# Patient Record
Sex: Male | Born: 1985 | Race: Black or African American | Hispanic: No | Marital: Single | Smoking: Never smoker
Health system: Southern US, Community
[De-identification: ages and names within clinical notes are randomized; demographics above are authoritative.]

## PROBLEM LIST (undated history)

## (undated) DIAGNOSIS — J45909 Unspecified asthma, uncomplicated: Secondary | ICD-10-CM

---

## 2016-11-20 ENCOUNTER — Encounter: Payer: Self-pay | Admitting: *Deleted

## 2016-11-20 ENCOUNTER — Emergency Department
Admission: EM | Admit: 2016-11-20 | Discharge: 2016-11-20 | Disposition: A | Discharge status: Home / Self Care | Payer: BLUE CROSS/BLUE SHIELD | Source: Home / Self Care | Attending: Family Medicine | Admitting: Family Medicine

## 2016-11-20 DIAGNOSIS — L600 Ingrowing nail: Secondary | ICD-10-CM | POA: Diagnosis not present

## 2016-11-20 MED ORDER — DOXYCYCLINE HYCLATE 100 MG PO CAPS: 100.0000 mg | ORAL_CAPSULE | Freq: Two times a day (BID) | ORAL | 0 refills | Status: DC

## 2016-11-20 NOTE — Discharge Instructions (Signed)
Leave bandage in place for 24 hours then change.  Begin warm soaks tomorrow once or twice daily until healed.  Apply bandage daily until healed.  May take Ibuprofen 200mg , 4 tabs every 8 hours with food.

## 2016-11-20 NOTE — ED Triage Notes (Signed)
Patient c/o 2 weeks of ingrown toenail to right great toe. Last night site was draining after Epsom salt soaks.

## 2016-11-20 NOTE — ED Provider Notes (Signed)
[name]    CSN: [id number] Arrival date & time: 11/20/16  0807     History   Chief Complaint Chief Complaint  Patient presents with  . Ingrown Toenail    HPI Manuel Thompson is a 31 y.o. male.   Patient complains of two week history of ingrown right great toenail.   The history is provided by the patient.  Toe Pain  This is a new problem. Episode onset: 2 weeks ago. The problem occurs constantly. The problem has been gradually worsening. The symptoms are aggravated by walking. Nothing relieves the symptoms. Treatments tried: Epsom salt soaks. The treatment provided no relief.    History reviewed. No pertinent past medical history.  There are no active problems to display for this patient.   History reviewed. No pertinent surgical history.     Home Medications    Prior to Admission medications   Medication Sig Start Date End Date Taking? Authorizing Provider  doxycycline (VIBRAMYCIN) 100 MG capsule Take 1 capsule (100 mg total) by mouth 2 (two) times daily. Take with food. 11/20/16   Lattie HawStephen A Neviah Braud, MD    Family History History reviewed. No pertinent family history.  Social History Social History  Substance Use Topics  . Smoking status: Never Smoker  . Smokeless tobacco: Never Used  . Alcohol use No     Allergies   Amoxicillin   Review of Systems Review of Systems  Constitutional: Negative for chills, diaphoresis and fever.  All other systems reviewed and are negative.    Physical Exam Triage Vital Signs ED Triage Vitals  Enc Vitals Group     BP 11/20/16 0832 115/75     Pulse Rate 11/20/16 0832 77     Resp --      Temp 11/20/16 0832 97.9 F (36.6 C)     Temp Source 11/20/16 0832 Oral     SpO2 11/20/16 0832 99 %     Weight 11/20/16 0833 194 lb (88 kg)     Height 11/20/16 0833 6' (1.829 m)     Head Circumference --      Peak Flow --      Pain Score 11/20/16 0838 7     Pain Loc --      Pain Edu? --      Excl. in GC? --     No data found.   Updated Vital Signs BP 115/75 (BP Location: Left Arm)   Pulse 77   Temp 97.9 F (36.6 C) (Oral)   Ht 6' (1.829 m)   Wt 194 lb (88 kg)   SpO2 99%   BMI 26.31 kg/m   Visual Acuity Right Eye Distance:   Left Eye Distance:   Bilateral Distance:    Right Eye Near:   Left Eye Near:    Bilateral Near:     Physical Exam  Constitutional: He appears well-developed and well-nourished. No distress.  HENT:  Head: Normocephalic.  Eyes: Pupils are equal, round, and reactive to light.  Cardiovascular: Normal rate.   Pulmonary/Chest: Effort normal.  Musculoskeletal:       Right foot: There is tenderness.       Feet:  Medial edge of right great toenail is ingrown, with mild swelling, erythema, and tenderness at medial edge of toenail.  Neurological: He is alert.  Skin: Skin is warm and dry.  Nursing note and vitals reviewed.    UC Treatments / Results  Labs (all labs ordered are listed, but only abnormal results are displayed)  Labs Reviewed  WOUND CULTURE   Narrative:    Performed at:  [facility]                [address], [address]                [place], [place] [address]    EKG  EKG Interpretation None       Radiology No results found.  Procedures Procedures  Partial toenail excision Explained benefits and risks of procedure and consent obtained.  With sterile technique and digital 0.25% bupivacaine anesthesia, resected approximately 3mm segment of medial edge of right first toenail without difficulty.  Cauterized base of exposed nail bed with silver nitrate.  Bandage with Xeroform gauze.  Wound precautions given.  Begin doxycycline.    Medications Ordered in UC Medications - No data to display   Initial Impression / Assessment and Plan / UC Course  I have reviewed the triage vital signs and the nursing notes.  Pertinent labs & imaging results that were available during my care of the patient were reviewed by me and considered  in my medical decision making (see chart for details).    Wound culture pending.  Begin doxycycline 100mg  BID for staph coverage. Leave bandage in place for 24 hours then change.  Begin warm soaks tomorrow once or twice daily until healed.  Apply bandage daily until healed.  May take Ibuprofen 200mg , 4 tabs every 8 hours with food.  Call for signs of worsening infection.    Final Clinical Impressions(s) / UC Diagnoses   Final diagnoses:  Ingrown right greater toenail    New Prescriptions Discharge Medication List as of 11/20/2016  9:40 AM    START taking these medications   Details  doxycycline (VIBRAMYCIN) 100 MG capsule Take 1 capsule (100 mg total) by mouth 2 (two) times daily. Take with food., Starting Fri 11/20/2016, Normal         Lattie Haw, MD 11/24/16 2226

## 2016-11-22 ENCOUNTER — Telehealth: Payer: Self-pay | Admitting: Emergency Medicine

## 2016-11-22 LAB — WOUND CULTURE
Gram Stain: NONE SEEN
Gram Stain: NONE SEEN
Organism ID, Bacteria: NORMAL

## 2016-11-22 NOTE — Telephone Encounter (Signed)
Pt informed of negative wound culture.  TMartin,CMA

## 2017-03-30 ENCOUNTER — Emergency Department
Admission: EM | Admit: 2017-03-30 | Discharge: 2017-03-30 | Disposition: A | Discharge status: Home / Self Care | Payer: BLUE CROSS/BLUE SHIELD | Source: Home / Self Care | Attending: Family Medicine | Admitting: Family Medicine

## 2017-03-30 ENCOUNTER — Encounter: Payer: Self-pay | Admitting: *Deleted

## 2017-03-30 DIAGNOSIS — J020 Streptococcal pharyngitis: Secondary | ICD-10-CM | POA: Diagnosis not present

## 2017-03-30 LAB — POCT RAPID STREP A (OFFICE): Rapid Strep A Screen: POSITIVE — AB

## 2017-03-30 MED ORDER — AZITHROMYCIN 250 MG PO TABS: 250.0000 mg | ORAL_TABLET | Freq: Every day | ORAL | 0 refills | Status: DC

## 2017-03-30 NOTE — ED Triage Notes (Signed)
Pt c/o swollen tonsils with pus on them x 2 days. Denies pain, cough or fever.

## 2017-03-30 NOTE — ED Provider Notes (Signed)
CSN: [id number]     Arrival date & time 03/30/17  0806 History   First MD Initiated Contact with Patient 03/30/17 [phone]     Chief Complaint  Patient presents with  . Sore Throat   (Consider location/radiation/quality/duration/timing/severity/associated sxs/prior Treatment) HPI  Manuel Thompson is a 31 y.o. male presenting to UC with c/o minimally sore throat and noticeable swollen tonsils with pus that started 2 days ago.  Swelling has been waxing and waning. Mild temporary relief with saltwater gargles and ibuprofen.  Pt notes he feels well otherwise. Denies fever, chills, HA, abdominal pain or n/v/d. No known sick contacts or recent travel.   History reviewed. No pertinent past medical history. History reviewed. No pertinent surgical history. History reviewed. No pertinent family history. Social History  Substance Use Topics  . Smoking status: Never Smoker  . Smokeless tobacco: Never Used  . Alcohol use Yes     Comment: 1-2 q wk    Review of Systems  Constitutional: Negative for chills and fever.  HENT: Positive for sore throat. Negative for congestion, ear pain, trouble swallowing and voice change.   Respiratory: Negative for cough and shortness of breath.   Cardiovascular: Negative for chest pain and palpitations.  Gastrointestinal: Negative for abdominal pain, diarrhea, nausea and vomiting.  Musculoskeletal: Negative for arthralgias, back pain and myalgias.  Skin: Negative for rash.  Neurological: Negative for dizziness, light-headedness and headaches.    Allergies  Amoxicillin  Home Medications   Prior to Admission medications   Medication Sig Start Date End Date Taking? Authorizing Provider  azithromycin (ZITHROMAX) 250 MG tablet Take 1 tablet (250 mg total) by mouth daily. Take first 2 tablets together, then 1 every day until finished. 03/30/17   Lurene Shadow, PA-C   Meds Ordered and Administered this Visit  Medications - No data to display  BP 127/88 (BP  Location: Left Arm)   Pulse 88   Temp 98.2 F (36.8 C) (Oral)   Resp 16   Ht 6' (1.829 m)   Wt 190 lb (86.2 kg)   SpO2 98%   BMI 25.77 kg/m  No data found.   Physical Exam  Constitutional: He is oriented to person, place, and time. He appears well-developed and well-nourished. No distress.  HENT:  Head: Normocephalic and atraumatic.  Right Ear: Tympanic membrane normal.  Left Ear: Tympanic membrane normal.  Nose: Nose normal.  Mouth/Throat: Uvula is midline and mucous membranes are normal. Oropharyngeal exudate, posterior oropharyngeal edema and posterior oropharyngeal erythema present. No tonsillar abscesses.  Eyes: EOM are normal.  Neck: Normal range of motion. Neck supple.  Cardiovascular: Normal rate and regular rhythm.   Pulmonary/Chest: Effort normal and breath sounds normal. No stridor. No respiratory distress. He has no wheezes. He has no rales.  Musculoskeletal: Normal range of motion.  Lymphadenopathy:    He has cervical adenopathy.  Neurological: He is alert and oriented to person, place, and time.  Skin: Skin is warm and dry. He is not diaphoretic.  Psychiatric: He has a normal mood and affect. His behavior is normal.  Nursing note and vitals reviewed.   Urgent Care Course     Procedures (including critical care time)  Labs Review Labs Reviewed  POCT RAPID STREP A (OFFICE) - Abnormal; Notable for the following:       Result Value   Rapid Strep A Screen Positive (*)    All other components within normal limits    Imaging Review No results found.   MDM  1. Strep pharyngitis    Rapid strep: POSITIVE  Pt has questionable allergy to PCN, possible anaphylaxis.  Rx: Azithromycin Encouraged acetaminophen and ibuprofen Home care instructions provided F/u with PCP in 1 week if not improving.     Lurene Shadowhelps, Kataleah Bejar O, [place] JerseyPA-C 03/30/17 1003

## 2017-05-03 ENCOUNTER — Encounter: Payer: Self-pay | Admitting: Emergency Medicine

## 2017-05-03 ENCOUNTER — Emergency Department
Admission: EM | Admit: 2017-05-03 | Discharge: 2017-05-03 | Disposition: A | Discharge status: Home / Self Care | Payer: BLUE CROSS/BLUE SHIELD | Source: Home / Self Care | Attending: Family Medicine | Admitting: Family Medicine

## 2017-05-03 DIAGNOSIS — S39012A Strain of muscle, fascia and tendon of lower back, initial encounter: Secondary | ICD-10-CM | POA: Diagnosis not present

## 2017-05-03 MED ORDER — MELOXICAM 15 MG PO TABS: 15.0000 mg | ORAL_TABLET | Freq: Every day | ORAL | 0 refills | Status: DC

## 2017-05-03 MED ORDER — CYCLOBENZAPRINE HCL 10 MG PO TABS: 10.0000 mg | ORAL_TABLET | Freq: Every day | ORAL | 0 refills | Status: DC

## 2017-05-03 NOTE — Discharge Instructions (Signed)
Apply ice pack for 20 to 30 minutes, 3 to 4 times daily  Continue until pain and swelling decrease.  Begin range of motion and stretching exercises as tolerated. Consider Alexander technique for maintaining healthy back.

## 2017-05-03 NOTE — ED Triage Notes (Signed)
Pt c/o low back pain after lifting his bike rack x2 days ago. Took motrin and a "muscle relaxer he had at home:" and states it helped.

## 2017-05-03 NOTE — ED Provider Notes (Signed)
[name]    CSN: [id number] Arrival date & time: 05/03/17  1951     History   Chief Complaint Chief Complaint  Patient presents with  . Back Pain    HPI Manuel Thompson is a 31 y.o. male.   Patient carried his bike rack in an awkward manner two days ago and subsequently felt bilateral non-radiating low back ache.   He denies bowel or bladder dysfunction, and no saddle numbness.  He had some left-over Flexeril that helped.   The history is provided by the patient.  Back Pain  Location:  Lumbar spine Quality:  Aching Radiates to:  Does not radiate Pain severity:  Moderate Pain is:  Worse during the day Onset quality:  Gradual Duration:  2 days Timing:  Constant Progression:  Unchanged Chronicity:  New Context: lifting heavy objects   Relieved by:  Muscle relaxants Worsened by:  Ambulation Associated symptoms: no abdominal pain, no abdominal swelling, no bladder incontinence, no bowel incontinence, no chest pain, no dysuria, no fever, no leg pain, no numbness, no paresthesias, no perianal numbness, no tingling and no weakness     History reviewed. No pertinent past medical history.  There are no active problems to display for this patient.   History reviewed. No pertinent surgical history.     Home Medications    Prior to Admission medications   Medication Sig Start Date End Date Taking? Authorizing Provider  cyclobenzaprine (FLEXERIL) 10 MG tablet Take 1 tablet (10 mg total) by mouth at bedtime. 05/03/17   Lattie HawBeese, Salim Forero A, MD  meloxicam (MOBIC) 15 MG tablet Take 1 tablet (15 mg total) by mouth daily. Take with food each morning 05/03/17   Lattie HawBeese, Ryon Layton A, MD    Family History History reviewed. No pertinent family history.  Social History Social History  Substance Use Topics  . Smoking status: Never Smoker  . Smokeless tobacco: Never Used  . Alcohol use Yes     Comment: 1-2 q wk     Allergies   Amoxicillin   Review of  Systems Review of Systems  Constitutional: Negative for fever.  Cardiovascular: Negative for chest pain.  Gastrointestinal: Negative for abdominal pain and bowel incontinence.  Genitourinary: Negative for bladder incontinence and dysuria.  Musculoskeletal: Positive for back pain.  Neurological: Negative for tingling, weakness, numbness and paresthesias.  All other systems reviewed and are negative.    Physical Exam Triage Vital Signs ED Triage Vitals  Enc Vitals Group     BP 05/03/17 2012 114/76     Pulse Rate 05/03/17 2012 78     Resp --      Temp 05/03/17 2012 98.4 F (36.9 C)     Temp Source 05/03/17 2012 Oral     SpO2 05/03/17 2012 98 %     Weight 05/03/17 2013 194 lb (88 kg)     Height --      Head Circumference --      Peak Flow --      Pain Score 05/03/17 2013 4     Pain Loc --      Pain Edu? --      Excl. in GC? --    No data found.   Updated Vital Signs BP 114/76 (BP Location: Right Arm)   Pulse 78   Temp 98.4 F (36.9 C) (Oral)   Wt 194 lb (88 kg)   SpO2 98%   BMI 26.31 kg/m   Visual Acuity Right Eye Distance:   Left Eye  Distance:   Bilateral Distance:    Right Eye Near:   Left Eye Near:    Bilateral Near:     Physical Exam  Constitutional: He appears well-developed and well-nourished. No distress.  HENT:  Head: Normocephalic.  Nose: Nose normal.  Mouth/Throat: Oropharynx is clear and moist.  Eyes: Pupils are equal, round, and reactive to light. Conjunctivae are normal.  Neck: Normal range of motion.  Cardiovascular: Normal heart sounds.   Pulmonary/Chest: Breath sounds normal.  Abdominal: There is no tenderness.  Musculoskeletal: He exhibits no edema.       Lumbar back: He exhibits decreased range of motion. He exhibits no tenderness and no swelling.       Back:  Back:  Can heel/toe walk and squat without difficulty.  Patient has pain in lower back as noted on diagram, but there is no tenderness to palpation in this area. Straight leg  raising test is negative.  Sitting knee extension test is negative.  Strength and sensation in the lower extremities is normal.  Patellar and achilles reflexes are normal   Neurological: He is alert.  Skin: Skin is warm and dry.  Nursing note and vitals reviewed.    UC Treatments / Results  Labs (all labs ordered are listed, but only abnormal results are displayed) Labs Reviewed - No data to display  EKG  EKG Interpretation None       Radiology No results found.  Procedures Procedures (including critical care time)  Medications Ordered in UC Medications - No data to display   Initial Impression / Assessment and Plan / UC Course  I have reviewed the triage vital signs and the nursing notes.  Pertinent labs & imaging results that were available during my care of the patient were reviewed by me and considered in my medical decision making (see chart for details).    Begin Mobic and Flexeril. Apply ice pack for 20 to 30 minutes, 3 to 4 times daily  Continue until pain and swelling decrease.  Begin range of motion and stretching exercises as tolerated. Consider Alexander technique for maintaining healthy back. Followup with Dr. Rodney Langtonhomas Thekkekandam or Dr. Clementeen GrahamEvan Corey ([facility]) if not improving about two weeks.     Final Clinical Impressions(s) / UC Diagnoses   Final diagnoses:  Strain of lumbar region, initial encounter    New Prescriptions New Prescriptions   CYCLOBENZAPRINE (FLEXERIL) 10 MG TABLET    Take 1 tablet (10 mg total) by mouth at bedtime.   MELOXICAM (MOBIC) 15 MG TABLET    Take 1 tablet (15 mg total) by mouth daily. Take with food each morning     Lattie HawBeese, Thamar Holik A, MD 05/04/17 1046

## 2017-08-16 ENCOUNTER — Emergency Department
Admission: EM | Admit: 2017-08-16 | Discharge: 2017-08-16 | Disposition: A | Discharge status: Home / Self Care | Payer: BLUE CROSS/BLUE SHIELD | Source: Home / Self Care | Attending: Family Medicine | Admitting: Family Medicine

## 2017-08-16 ENCOUNTER — Other Ambulatory Visit: Payer: Self-pay

## 2017-08-16 ENCOUNTER — Encounter: Payer: Self-pay | Admitting: Emergency Medicine

## 2017-08-16 DIAGNOSIS — M5442 Lumbago with sciatica, left side: Secondary | ICD-10-CM | POA: Diagnosis not present

## 2017-08-16 MED ORDER — METHOCARBAMOL 500 MG PO TABS: 500.0000 mg | ORAL_TABLET | Freq: Two times a day (BID) | ORAL | 0 refills | Status: DC

## 2017-08-16 MED ORDER — PREDNISONE 20 MG PO TABS: ORAL_TABLET | ORAL | 0 refills | Status: DC

## 2017-08-16 MED ORDER — MELOXICAM 15 MG PO TABS: 15.0000 mg | ORAL_TABLET | Freq: Every day | ORAL | 0 refills | Status: DC

## 2017-08-16 NOTE — Discharge Instructions (Signed)
°  Robaxin (methocarbamol) is a muscle relaxer and may cause drowsiness. Do not drink alcohol, drive, or operate heavy machinery while taking. ° °Meloxicam (Mobic) is an antiinflammatory to help with pain and inflammation.  Do not take ibuprofen, Advil, Aleve, or any other medications that contain NSAIDs while taking meloxicam as this may cause stomach upset or even ulcers if taken in large amounts for an extended period of time.  ° °

## 2017-08-16 NOTE — ED Provider Notes (Signed)
[name]    CSN: [id number] Arrival date & time: 08/16/17  1724     History   Chief Complaint Chief Complaint  Patient presents with  . Leg Pain    HPI Laurey MoraleDominique Benninger is a 31 y.o. male.   HPI  Laurey MoraleDominique Dippolito is a 31 y.o. male presenting to UC with c/o 3 weeks of intermittent, gradually worsening Left lower back pain that radiates down Left leg into foot.  Pain is aching and sore, sharp on occasion, worse after trying to stretch his muscles yesterday.  Pt woke with the pain 3 weeks ago. No known injury. He does report sleeping on a couch frequently and wonders if that caused his pain.  Hx of lower back pain w/o radiation down his leg in July of this year. He was prescribed Flexeril at that time and has been trying that for current pain w/o relief. He has not tried Tylenol or Motrin.  Denies urinary symptoms. No fever or chills.    History reviewed. No pertinent past medical history.  There are no active problems to display for this patient.   History reviewed. No pertinent surgical history.     Home Medications    Prior to Admission medications   Medication Sig Start Date End Date Taking? Authorizing Provider  cyclobenzaprine (FLEXERIL) 10 MG tablet Take 1 tablet (10 mg total) by mouth at bedtime. 05/03/17   Lattie HawBeese, Stephen A, MD  meloxicam (MOBIC) 15 MG tablet Take 1 tablet (15 mg total) daily by mouth. For 5 days, then daily as needed for pain 08/16/17   Lurene ShadowPhelps, Jovon Winterhalter O, PA-C  methocarbamol (ROBAXIN) 500 MG tablet Take 1 tablet (500 mg total) 2 (two) times daily by mouth. 08/16/17   Lurene ShadowPhelps, Kimala Horne O, PA-C  predniSONE (DELTASONE) 20 MG tablet 3 tabs po day one, then 2 po daily x 4 days 08/16/17   Lurene ShadowPhelps, Siya Flurry O, PA-C    Family History No family history on file.  Social History Social History   Tobacco Use  . Smoking status: Never Smoker  . Smokeless tobacco: Never Used  Substance Use Topics  . Alcohol use: Yes    Comment: 1-2 q wk  . Drug use: No       Allergies   Amoxicillin   Review of Systems Review of Systems  Constitutional: Negative for chills and fever.  Genitourinary: Negative for dysuria, flank pain, frequency and hematuria.  Musculoskeletal: Positive for back pain and myalgias. Negative for arthralgias, gait problem and joint swelling.  Skin: Negative for color change and rash.  Neurological: Negative for weakness and numbness.     Physical Exam Triage Vital Signs ED Triage Vitals  Enc Vitals Group     BP 08/16/17 1738 120/79     Pulse Rate 08/16/17 1738 100     Resp 08/16/17 1738 16     Temp 08/16/17 1738 98.1 F (36.7 C)     Temp Source 08/16/17 1738 Oral     SpO2 08/16/17 1738 98 %     Weight 08/16/17 1739 192 lb (87.1 kg)     Height 08/16/17 1739 6' (1.829 m)     Head Circumference --      Peak Flow --      Pain Score 08/16/17 1739 6     Pain Loc --      Pain Edu? --      Excl. in GC? --    No data found.  Updated Vital Signs BP 120/79 (BP Location: Right  Arm)   Pulse 100   Temp 98.1 F (36.7 C) (Oral)   Resp 16   Ht 6' (1.829 m)   Wt 192 lb (87.1 kg)   SpO2 98%   BMI 26.04 kg/m   Visual Acuity Right Eye Distance:   Left Eye Distance:   Bilateral Distance:    Right Eye Near:   Left Eye Near:    Bilateral Near:     Physical Exam  Constitutional: He is oriented to person, place, and time. He appears well-developed and well-nourished. No distress.  HENT:  Head: Normocephalic and atraumatic.  Eyes: EOM are normal.  Neck: Normal range of motion.  Cardiovascular: Normal rate.  Pulmonary/Chest: Effort normal. No respiratory distress.  Musculoskeletal: Normal range of motion. He exhibits tenderness. He exhibits no edema.  No midline spinal tenderness. Tenderness to Left lower back, Left buttock, and mildly in Left calf. Calf is soft.  Negative straight leg raise.  Normal gait.  Neurological: He is alert and oriented to person, place, and time.  Skin: Skin is warm and dry. Capillary  refill takes less than 2 seconds. No rash noted. He is not diaphoretic. No erythema.  Psychiatric: He has a normal mood and affect. His behavior is normal.  Nursing note and vitals reviewed.    UC Treatments / Results  Labs (all labs ordered are listed, but only abnormal results are displayed) Labs Reviewed - No data to display  EKG  EKG Interpretation None       Radiology No results found.  Procedures Procedures (including critical care time)  Medications Ordered in UC Medications - No data to display   Initial Impression / Assessment and Plan / UC Course  I have reviewed the triage vital signs and the nursing notes.  Pertinent labs & imaging results that were available during my care of the patient were reviewed by me and considered in my medical decision making (see chart for details).     Hx and exam c/w Left lower back pain with Left side sciatica. No red flag symptoms Home care instrucitons provided F/u with PCP or Sports Medicine in 1 week if not improving or for recurrent back pain.   Final Clinical Impressions(s) / UC Diagnoses   Final diagnoses:  Acute left-sided low back pain with left-sided sciatica    ED Discharge Orders        Ordered    methocarbamol (ROBAXIN) 500 MG tablet  2 times daily     08/16/17 1801    meloxicam (MOBIC) 15 MG tablet  Daily     08/16/17 1801    predniSONE (DELTASONE) 20 MG tablet     08/16/17 1801       Controlled Substance Prescriptions [place] Controlled Substance Registry consulted? Not Applicable   Rolla Platehelps, Cashae Weich O, PA-C 08/16/17 1946

## 2017-08-16 NOTE — ED Triage Notes (Signed)
For past 3 weeks patient describes pain starting on top of his left foot that radiates up his leg into buttock area, depending on certain movements; no known injury. Has tried leftover muscle relaxant that did not provide relief.

## 2017-10-14 ENCOUNTER — Emergency Department
Admission: EM | Admit: 2017-10-14 | Discharge: 2017-10-14 | Disposition: A | Discharge status: Home / Self Care | Payer: BLUE CROSS/BLUE SHIELD | Source: Home / Self Care | Attending: Family Medicine | Admitting: Family Medicine

## 2017-10-14 ENCOUNTER — Other Ambulatory Visit: Payer: Self-pay

## 2017-10-14 ENCOUNTER — Encounter: Payer: Self-pay | Admitting: *Deleted

## 2017-10-14 DIAGNOSIS — M5432 Sciatica, left side: Secondary | ICD-10-CM

## 2017-10-14 DIAGNOSIS — Z113 Encounter for screening for infections with a predominantly sexual mode of transmission: Secondary | ICD-10-CM | POA: Diagnosis not present

## 2017-10-14 MED ORDER — MELOXICAM 15 MG PO TABS: 15.0000 mg | ORAL_TABLET | Freq: Every day | ORAL | 0 refills | Status: DC

## 2017-10-14 MED ORDER — METHOCARBAMOL 500 MG PO TABS: 500.0000 mg | ORAL_TABLET | Freq: Two times a day (BID) | ORAL | 0 refills | Status: DC

## 2017-10-14 NOTE — ED Triage Notes (Signed)
Patient c/o left leg and glute pain. Previous sciatica diagnosis. Wants STD screening. Asymptomatic.   Password for results: "[id number]" phone #[phone]

## 2017-10-14 NOTE — ED Provider Notes (Signed)
[facility]    CSN: [id number] Arrival date & time: 10/14/17  1203     History   Chief Complaint Chief Complaint  Patient presents with  . Sciatica  . STD screen    HPI Manuel Thompson is a 32 y.o. male.   The history is provided by the patient. No language interpreter was used.  Back Pain  Location:  Sacro-iliac joint Quality:  Aching Radiates to:  L posterior upper leg Pain severity:  Mild Pain is:  Same all the time Onset quality:  Unable to specify Timing:  Constant Progression:  Partially resolved Chronicity:  New Relieved by:  Nothing Worsened by:  Nothing Ineffective treatments:  None tried Associated symptoms: no paresthesias, no perianal numbness and no weakness   Pt reports some decreased range of motion  History reviewed. No pertinent past medical history.  There are no active problems to display for this patient.   History reviewed. No pertinent surgical history.     Home Medications    Prior to Admission medications   Medication Sig Start Date End Date Taking? Authorizing Provider  meloxicam (MOBIC) 15 MG tablet Take 1 tablet (15 mg total) by mouth daily. 10/14/17   Elson Areas, PA-C  methocarbamol (ROBAXIN) 500 MG tablet Take 1 tablet (500 mg total) by mouth 2 (two) times daily. 10/14/17   Elson Areas, PA-C    Family History History reviewed. No pertinent family history.  Social History Social History   Tobacco Use  . Smoking status: Never Smoker  . Smokeless tobacco: Never Used  Substance Use Topics  . Alcohol use: Yes    Comment: 1-2 q wk  . Drug use: No     Allergies   Amoxicillin   Review of Systems Review of Systems  Musculoskeletal: Positive for back pain.  Neurological: Negative for weakness and paresthesias.  All other systems reviewed and are negative.    Physical Exam Triage Vital Signs ED Triage Vitals [10/14/17 1231]  Enc Vitals Group     BP 126/83     Pulse Rate 90     Resp    Temp 98.4 F (36.9 C)     Temp Source Oral     SpO2 98 %     Weight 195 lb (88.5 kg)     Height      Head Circumference      Peak Flow      Pain Score 2     Pain Loc      Pain Edu?      Excl. in GC?    No data found.  Updated Vital Signs BP 126/83 (BP Location: Right Arm)   Pulse 90   Temp 98.4 F (36.9 C) (Oral)   Wt 195 lb (88.5 kg)   SpO2 98%   BMI 26.45 kg/m   Visual Acuity Right Eye Distance:   Left Eye Distance:   Bilateral Distance:    Right Eye Near:   Left Eye Near:    Bilateral Near:     Physical Exam  Constitutional: He appears well-developed and well-nourished.  HENT:  Head: Normocephalic and atraumatic.  Eyes: Conjunctivae are normal.  Neck: Neck supple.  Cardiovascular: Normal rate and regular rhythm.  No murmur heard. Pulmonary/Chest: Effort normal and breath sounds normal. No respiratory distress.  Abdominal: Soft. There is no tenderness.  Musculoskeletal: He exhibits no edema.  Tender sciatic notch.  nv and ns itnact  Neurological: He is alert.  Skin: Skin is warm and  dry.  Psychiatric: He has a normal mood and affect.  Nursing note and vitals reviewed.    UC Treatments / Results  Labs (all labs ordered are listed, but only abnormal results are displayed) Labs Reviewed  C. TRACHOMATIS/N. GONORRHOEAE RNA  HIV ANTIBODY (ROUTINE TESTING)  RPR    EKG  EKG Interpretation None       Radiology No results found.  Procedures Procedures (including critical care time)  Medications Ordered in UC Medications - No data to display   Initial Impression / Assessment and Plan / UC Course  I have reviewed the triage vital signs and the nursing notes.  Pertinent labs & imaging results that were available during my care of the patient were reviewed by me and considered in my medical decision making (see chart for details).    Pt also request std screening.  No symptoms.  Pt denies high risk behavior.  He reports he would just like to  have checked.  HIV, RPR, GC and Ct ordered.    Final Clinical Impressions(s) / UC Diagnoses   Final diagnoses:  Screen for STD (sexually transmitted disease)  Sciatica, left side    ED Discharge Orders        Ordered    meloxicam (MOBIC) 15 MG tablet  Daily     10/14/17 1238    methocarbamol (ROBAXIN) 500 MG tablet  2 times daily     10/14/17 1238       Controlled Substance Prescriptions [place] Controlled Substance Registry consulted? Not Applicable  An After Visit Summary was printed and given to the patient.    Elson AreasSofia, Taffy Delconte K, [place]PA-C 10/14/17 1252

## 2017-10-14 NOTE — Discharge Instructions (Signed)
Return if any problems.

## 2017-10-15 ENCOUNTER — Telehealth: Payer: Self-pay | Admitting: *Deleted

## 2017-10-15 LAB — C. TRACHOMATIS/N. GONORRHOEAE RNA
C. trachomatis RNA, TMA: NOT DETECTED
N. gonorrhoeae RNA, TMA: NOT DETECTED

## 2017-10-15 LAB — RPR: RPR: NONREACTIVE

## 2017-10-15 LAB — HIV ANTIBODY (ROUTINE TESTING W REFLEX): HIV 1&2 Ab, 4th Generation: NONREACTIVE

## 2017-10-15 NOTE — Telephone Encounter (Signed)
Password verified and lab results given. Manuel Catholichristy Efstathios Sawin, LPN

## 2017-10-28 ENCOUNTER — Encounter: Payer: Self-pay | Admitting: Sports Medicine

## 2017-10-28 ENCOUNTER — Ambulatory Visit (INDEPENDENT_AMBULATORY_CARE_PROVIDER_SITE_OTHER): Payer: BLUE CROSS/BLUE SHIELD | Admitting: Sports Medicine

## 2017-10-28 ENCOUNTER — Ambulatory Visit (INDEPENDENT_AMBULATORY_CARE_PROVIDER_SITE_OTHER): Payer: BLUE CROSS/BLUE SHIELD

## 2017-10-28 DIAGNOSIS — M4317 Spondylolisthesis, lumbosacral region: Secondary | ICD-10-CM | POA: Diagnosis not present

## 2017-10-28 DIAGNOSIS — M47896 Other spondylosis, lumbar region: Secondary | ICD-10-CM

## 2017-10-28 DIAGNOSIS — M5416 Radiculopathy, lumbar region: Secondary | ICD-10-CM

## 2017-10-28 MED ORDER — PREDNISONE 50 MG PO TABS: ORAL_TABLET | ORAL | 0 refills | Status: DC

## 2017-10-28 MED ORDER — MELOXICAM 15 MG PO TABS
ORAL_TABLET | ORAL | 3 refills | Status: DC
Start: 2017-10-28 — End: 2018-12-14

## 2017-10-28 NOTE — Patient Instructions (Signed)

## 2017-10-28 NOTE — Assessment & Plan Note (Signed)
Left L5 distribution. Prednisone, meloxicam, x-rays, formal PT. Return in 6 weeks, MR for interventional planning if no better.

## 2017-10-28 NOTE — Progress Notes (Signed)
Subjective:    I'm seeing this patient as a consultation for: Dr. Donna Christen  CC: Low back pain  HPI: Manuel Thompson is a pleasant 32 year old male, for the past several months he has had increasing pain in his left side of the low back with radiation down the left leg to the top of the left foot, with numbness and tingling.  He has been seen a few times, prescribed some muscle relaxers with minimal relief.  He saw a chiropractor once or twice, unfortunately he had persistent pain, worse with sitting, flexion, Valsalva with radiation down the left leg as described, no bowel or bladder dysfunction, saddle numbness, no constitutional symptoms, no history of trauma.  I reviewed the past medical history, family history, social history, surgical history, and allergies today and no changes were needed.  Please see the problem list section below in epic for further details.  Past Medical History: No past medical history on file. Past Surgical History: No past surgical history on file. Social History: Social History   Socioeconomic History  . Marital status: Single    Spouse name: None  . Number of children: None  . Years of education: None  . Highest education level: None  Social Needs  . Financial resource strain: None  . Food insecurity - worry: None  . Food insecurity - inability: None  . Transportation needs - medical: None  . Transportation needs - non-medical: None  Occupational History  . None  Tobacco Use  . Smoking status: Never Smoker  . Smokeless tobacco: Never Used  Substance and Sexual Activity  . Alcohol use: Yes    Comment: 1-2 q wk  . Drug use: No  . Sexual activity: None  Other Topics Concern  . None  Social History Narrative  . None   Family History: No family history on file. Allergies: Allergies  Allergen Reactions  . Amoxicillin Rash and Other (See Comments)    Childhood allergy. Questions if his throat may have swollen.    Medications: See med  rec.  Review of Systems: No headache, visual changes, nausea, vomiting, diarrhea, constipation, dizziness, abdominal pain, skin rash, fevers, chills, night sweats, weight loss, swollen lymph nodes, body aches, joint swelling, muscle aches, chest pain, shortness of breath, mood changes, visual or auditory hallucinations.   Objective:   General: Well Developed, well nourished, and in no acute distress.  Neuro:  Extra-ocular muscles intact, able to move all 4 extremities, sensation grossly intact.  Deep tendon reflexes tested were normal. Psych: Alert and oriented, mood congruent with affect. ENT:  Ears and nose appear unremarkable.  Hearing grossly normal. Neck: Unremarkable overall appearance, trachea midline.  No visible thyroid enlargement. Eyes: Conjunctivae and lids appear unremarkable.  Pupils equal and round. Skin: Warm and dry, no rashes noted.  Cardiovascular: Pulses palpable, no extremity edema. Back Exam:  Inspection: Unremarkable  Motion: Flexion 45 deg, Extension 45 deg, Side Bending to 45 deg bilaterally,  Rotation to 45 deg bilaterally  SLR laying: Positive on the left XSLR laying: Negative  Palpable tenderness: None. FABER: negative. Sensory change: Gross sensation intact to all lumbar and sacral dermatomes.  Reflexes: 2+ at both patellar tendons, 2+ at achilles tendons, Babinski's downgoing.  Strength at foot  Plantar-flexion: 5/5 Dorsi-flexion: 5/5 Eversion: 5/5 Inversion: 5/5  Leg strength  Quad: 5/5 Hamstring: 5/5 Hip flexor: 5/5 Hip abductors: 5/5  Gait unremarkable.  Lumbar spine x-rays show accentuation of the normal lumbar lordosis, he does have an L5 what appears to be  bilateral pars defect with grade 1, L5 on S1 spondylolisthesis.  Impression and Recommendations:   This case required medical decision making of moderate complexity.  Spondylolisthesis at L5-S1 level, with left L5 radiculitis Left L5 distribution. Prednisone, meloxicam, x-rays, formal  PT. Return in 6 weeks, MR for interventional planning if no better. ___________________________________________ Manuel Thompson J. Benjamin Stainhekkekandam, M.D., ABFM., CAQSM. Primary Care and Sports Medicine [facility]  Adjunct Instructor of Family Medicine  [employer]

## 2017-11-08 ENCOUNTER — Ambulatory Visit: Payer: BLUE CROSS/BLUE SHIELD | Admitting: Rehabilitative and Restorative Service Providers"

## 2017-11-08 ENCOUNTER — Encounter: Payer: Self-pay | Admitting: Rehabilitative and Restorative Service Providers"

## 2017-11-08 DIAGNOSIS — R29898 Other symptoms and signs involving the musculoskeletal system: Secondary | ICD-10-CM

## 2017-11-08 DIAGNOSIS — M545 Low back pain: Secondary | ICD-10-CM

## 2017-11-08 DIAGNOSIS — M6281 Muscle weakness (generalized): Secondary | ICD-10-CM | POA: Diagnosis not present

## 2017-11-08 NOTE — Patient Instructions (Signed)
Trunk: Prone Extension (Press-Ups)    Lie on stomach on firm, flat surface. Relax bottom and legs. Raise chest in air with elbows straight. Keep hips flat on surface, sag stomach. Hold __1-2__ seconds. Repeat _10___ times. Do __2-4__ sessions per day. CAUTION: Movement should be gentle and slow.   HIP: Hamstrings - Supine  Place strap around foot. Raise leg up, keeping knee straight.  Bend opposite knee to protect back if indicated. Hold 30 seconds. 3 reps per set, 2-3 sets per day   Piriformis Stretch   Lying on back, pull right knee toward opposite shoulder. Hold 30 seconds. Repeat 3 times. Do 2-3 sessions per day.   Quads / HF, Prone KNEE: Quadriceps - Prone    Place strap around ankle. Bring ankle toward buttocks. Press hip into surface. Hold 30 seconds. Repeat 3 times per session. Do 2-3 sessions per day.   Sleeping on Back  Place pillow under knees. A pillow with cervical support and a roll around waist are also helpful. Copyright  VHI. All rights reserved.  Sleeping on Side Place pillow between knees. Use cervical support under neck and a roll around waist as needed. Copyright  VHI. All rights reserved.   Sleeping on Stomach   If this is the only desirable sleeping position, place pillow under lower legs, and under stomach or chest as needed.  Posture - Sitting   Sit upright, head facing forward. Try using a roll to support lower back. Keep shoulders relaxed, and avoid rounded back. Keep hips level with knees. Avoid crossing legs for long periods. Stand to Sit / Sit to Stand   To sit: Bend knees to lower self onto front edge of chair, then scoot back on seat. To stand: Reverse sequence by placing one foot forward, and scoot to front of seat. Use rocking motion to stand up.   Work Height and Reach  Ideal work height is no more than 2 to 4 inches below elbow level when standing, and at elbow level when sitting. Reaching should be limited to arm's length,  with elbows slightly bent.  Bending  Bend at hips and knees, not back. Keep feet shoulder-width apart.    Posture - Standing   Good posture is important. Avoid slouching and forward head thrust. Maintain curve in low back and align ears over shoul- ders, hips over ankles.  Alternating Positions   Alternate tasks and change positions frequently to reduce fatigue and muscle tension. Take rest breaks. Computer Work   Position work to Art gallery managerface forward. Use proper work and seat height. Keep shoulders back and down, wrists straight, and elbows at right angles. Use chair that provides full back support. Add footrest and lumbar roll as needed.  Getting Into / Out of Car  Lower self onto seat, scoot back, then bring in one leg at a time. Reverse sequence to get out.  Dressing  Lie on back to pull socks or slacks over feet, or sit and bend leg while keeping back straight.    Housework - Sink  Place one foot on ledge of cabinet under sink when standing at sink for prolonged periods.   Pushing / Pulling  Pushing is preferable to pulling. Keep back in proper alignment, and use leg muscles to do the work.  Deep Squat   Squat and lift with both arms held against upper trunk. Tighten stomach muscles without holding breath. Use smooth movements to avoid jerking.  Avoid Twisting   Avoid twisting or bending back. Pivot around  using foot movements, and bend at knees if needed when reaching for articles.  Carrying Luggage   Distribute weight evenly on both sides. Use a cart whenever possible. Do not twist trunk. Move body as a unit.   Lifting Principles .Maintain proper posture and head alignment. .Slide object as close as possible before lifting. .Move obstacles out of the way. .Test before lifting; ask for help if too heavy. .Tighten stomach muscles without holding breath. .Use smooth movements; do not jerk. .Use legs to do the work, and pivot with feet. .Distribute the work  load symmetrically and close to the center of trunk. .Push instead of pull whenever possible.   Ask For Help   Ask for help and delegate to others when possible. Coordinate your movements when lifting together, and maintain the low back curve.  Log Roll   Lying on back, bend left knee and place left arm across chest. Roll all in one movement to the right. Reverse to roll to the left. Always move as one unit. Housework - Sweeping  Use long-handled equipment to avoid stooping.   Housework - Wiping  Position yourself as close as possible to reach work surface. Avoid straining your back.  Laundry - Unloading Wash   To unload small items at bottom of washer, lift leg opposite to arm being used to reach.  Gardening - Raking  Move close to area to be raked. Use arm movements to do the work. Keep back straight and avoid twisting.     Cart  When reaching into cart with one arm, lift opposite leg to keep back straight.   Getting Into / Out of Bed  Lower self to lie down on one side by raising legs and lowering head at the same time. Use arms to assist moving without twisting. Bend both knees to roll onto back if desired. To sit up, start from lying on side, and use same move-ments in reverse. Housework - Vacuuming  Hold the vacuum with arm held at side. Step back and forth to move it, keeping head up. Avoid twisting.   Laundry - Armed forces training and education officer so that bending and twisting can be avoided.   Laundry - Unloading Dryer  Squat down to reach into clothes dryer or use a reacher.  Gardening - Weeding / Psychiatric nurse or Kneel. Knee pads may be helpful.

## 2017-11-08 NOTE — Therapy (Signed)
[facility] [address] [place], [place], [address] Phone: [phone]   Fax:  [phone]  Physical Therapy Evaluation  Patient Details  Name: Manuel Thompson MRN: [id number] Date of Birth: [birth year] Referring Provider: Dr Benjamin Stain    Encounter Date: 11/08/2017  PT End of Session - 11/08/17 1315    Visit Number  1    Number of Visits  12    Date for PT Re-Evaluation  12/20/17    PT Start Time  0845    PT Stop Time  0943    PT Time Calculation (min)  58 min    Activity Tolerance  Patient tolerated treatment well       History reviewed. No pertinent past medical history.  History reviewed. No pertinent surgical history.  There were no vitals filed for this visit.   Subjective Assessment - 11/08/17 0854    Subjective  Patient reports that he was cycling through the summer and stopped in October. He was sleeping on the couch. He awoke with Lt LE pain in November with stabbing, throbbing pain in the Lt LE. He was seen by MD in December and treated with medication with some improvement. He continues to have pain in the Lt LE and significant tightness in the Lt LE with difficulty with bending and functional activities.     Pertinent History  history of LBP on and off usually lasting no more than a week occurring ~ 2 times a year resolved with rest    How long can you sit comfortably?  straight chair 10 min cushioned 30 min     How long can you stand comfortably?  no limit    How long can you walk comfortably?  no limit     Diagnostic tests  xrays - spondylolisthesis     Patient Stated Goals  get full ROM back in the Lt LE and get rid of pain     Currently in Pain?  Yes    Pain Score  3     Pain Location  Hip    Pain Orientation  Left    Pain Descriptors / Indicators  Squeezing    Pain Type  Acute pain    Pain Radiating Towards  left glut     Pain Onset  More than a month ago    Pain Frequency  Intermittent     Aggravating Factors   sitting; sneezing; coughing; low sitting; squatting; balanceing on Lt LE     Pain Relieving Factors  meds; sitting in stretch crossing his leg          [facility] PT Assessment - 11/08/17 0001      Assessment   Medical Diagnosis  lumbar radiculitis; Lt LE pain     Referring Provider  Dr Benjamin Stain     Onset Date/Surgical Date  08/05/17    Hand Dominance  Left    Next MD Visit  6 weeks     Prior Therapy  yes       Precautions   Precautions  None      Balance Screen   Has the patient fallen in the past 6 months  No    Has the patient had a decrease in activity level because of a fear of falling?   No    Is the patient reluctant to leave their home because of a fear of falling?   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     Leisure  cycling; gym - 2-3 days/week - free weights and machines       Sensation   Additional Comments  WNL's per pt report       Posture/Postural Control   Posture Comments  head forward; shoudlers rounded; sits with rounded posture       AROM   Lumbar Flexion  15% pain Lt LB    Lumbar Extension  55%    Lumbar - Right Side Bend  80%  pull Lt LB/hip     Lumbar - Left Side Bend  100%    Lumbar - Right Rotation  80%    Lumbar - Left Rotation  80%      Strength   Left Hip Extension  4/5      Flexibility   Hamstrings  tight Rt 70 deg; Lt 40 deg     Quadriceps  tigth Lt > Rt     ITB  tight Lt > Rt     Piriformis  tight Lt       Palpation   Spinal mobility  painful with CPA mobs lumbar spine greatest at L4/5    SI assessment   slight elevation and rotatioin Lt SI     Palpation comment  tenderness Lt lumbar paraspinals, gluts; piriformis       Special Tests   Other special tests  SLR (+) Lt at 40 deg              Objective measurements completed on examination: See above findings.       [facility] Adult PT Treatment/Exercise - 11/08/17 0001      Therapeutic Activites    Therapeutic Activities  -- initiated back care education       Lumbar Exercises: Stretches   Passive Hamstring Stretch  Right;Left;2 reps;30 seconds supine with strap     Press Ups  -- 1-2 sec pause; 10 reps; no pain     Quad Stretch  Left;2 reps;30 seconds prone with strap     Piriformis Stretch  Left;2 reps;30 seconds supine travell       Moist Heat Therapy   Number Minutes Moist Heat  20 Minutes    Moist Heat Location  Lumbar Spine;Hip      Electrical Stimulation   Electrical Stimulation Location  bilat Lumbar spiine; Lt posterior hip     Electrical Stimulation Action  IFC    Electrical Stimulation Parameters  to tolerance    Electrical Stimulation Goals  Pain;Tone             PT Education - 11/08/17 [phone]    Education provided  Yes    Education Details  HEP back care     Person(s) Educated  Patient    Methods  Explanation;Demonstration;Tactile cues;Verbal cues;Handout    Comprehension  Verbalized understanding;Returned demonstration;Verbal cues required;Tactile cues required          PT Long Term Goals - 11/08/17 1332      PT LONG TERM GOAL #1   Title  Improve trunk and LE moblity and ROM to WFL's without pain 12/20/17    Time  6    Period  Weeks    Status  New      PT LONG TERM GOAL #2   Title  Increase Lt LE strength to 5-/5 to 5/5 without pain with  testing 12/20/17    Time  6    Period  Weeks    Status  New      PT LONG TERM GOAL #3   Title  Decrease pain by 50-75% allowing patient to participate normal functional activities 12/20/17    Time  6    Period  Weeks    Status  New      PT LONG TERM GOAL #4   Title  Independent in HEP 12/20/17    Time  6    Period  Weeks    Status  New      PT LONG TERM GOAL #5   Title  Improve FOTO to </= 23% limitation 12/20/17    Time  6    Period  Weeks    Status  New             Plan - 11/08/17 1326    Clinical  Impression Statement  Patient presents with 3 month history of Lt LE pain with no known injury. He has a history of intermittent LBP occuring ~2 times per year over the past several years. Normally symptoms resolve in a couple of days with rest. The current symtpoms have improved some but patient has persistent Lt posterior hip pain. He has poor sitting and standing posture; significant limitations in trunk and LE ROM/mobility; Lt LE weakness; muscular tightness through the Lt > Rt hip and trunk musculature; pain limiting functional activities. He wil lbenefit from PT to address problems identified.     Clinical Presentation  Stable    Clinical Decision Making  Low    Rehab Potential  Good    PT Frequency  2x / week    PT Duration  6 weeks    PT Treatment/Interventions  Patient/family education;ADLs/Self Care Home Management;Cryotherapy;Electrical Stimulation;Iontophoresis 4mg /ml Dexamethasone;Moist Heat;Traction;Ultrasound;Dry needling;Manual techniques;Neuromuscular re-education;Therapeutic activities;Therapeutic exercise    PT Next Visit Plan  review HEP; progress with LE stretching; add core stabilization as tolerated; manual work through [employer] psoas/posterior hip/lumbar spine; continue spine care education; modalities as indicated     Consulted and Agree with Plan of Care  Patient       Patient will benefit from skilled therapeutic intervention in order to improve the following deficits and impairments:  Postural dysfunction, Improper body mechanics, Pain, Increased fascial restricitons, Increased muscle spasms, Decreased mobility, Decreased range of motion, Decreased strength, Decreased activity tolerance  Visit Diagnosis: Acute left-sided low back pain, with sciatica presence unspecified - Plan: PT plan of care cert/re-cert  Other symptoms and signs involving the musculoskeletal system - Plan: PT plan of care cert/re-cert  Muscle weakness (generalized) - Plan: PT plan of care  cert/re-cert     Problem List Patient Active Problem List   Diagnosis Date Noted  . Spondylolisthesis at L5-S1 level, with left L5 radiculitis 10/28/2017    Mechel Haggard Rober Minion PT, MPH  11/08/2017, 1:41 PM  [facility] [address] [place], [place], [address] Phone: [phone]   Fax:  [phone]  Name: Kylian Loh MRN: [id number] Date of Birth: May 14, 1986

## 2017-11-10 ENCOUNTER — Ambulatory Visit: Payer: BLUE CROSS/BLUE SHIELD | Admitting: Physical Therapy

## 2017-11-10 DIAGNOSIS — M6281 Muscle weakness (generalized): Secondary | ICD-10-CM | POA: Diagnosis not present

## 2017-11-10 DIAGNOSIS — M545 Low back pain: Secondary | ICD-10-CM

## 2017-11-10 DIAGNOSIS — R29898 Other symptoms and signs involving the musculoskeletal system: Secondary | ICD-10-CM | POA: Diagnosis not present

## 2017-11-10 NOTE — Therapy (Signed)
[place], [place], [address] Phone: [phone]   Fax:  [phone]  Physical Therapy Treatment  Patient Details  Name: Manuel Thompson MRN: [id number] Date of Birth: May 24, 1986 Referring Provider: Dr. Dianah Field   Encounter Date: 11/10/2017  PT End of Session - 11/10/17 0807    Visit Number  2    Number of Visits  12    Date for PT Re-Evaluation  12/20/17    PT Start Time  0720 pt arrived late    PT Stop Time  0820    PT Time Calculation (min)  60 min    Activity Tolerance  Patient limited by pain       No past medical history on file.  No past surgical history on file.  There were no vitals filed for this visit.  Subjective Assessment - 11/10/17 0724    Subjective  Pt reports he notices he has pain when he sits on the commode, he also noticed that his Lt glute feels/looks smaller than Rt.      Currently in Pain?  No/denies    Pain Score  0-No pain up to 3/10 with sitting on toilet    Pain Location  Buttocks    Pain Orientation  Left         [facility] PT Assessment - 11/10/17 0001      Assessment   Medical Diagnosis  lumbar radiculitis; Lt LE pain     Referring Provider  Dr. Dianah Field    Onset Date/Surgical Date  08/05/17    Hand Dominance  Left    Next MD Visit  6 weeks       Flexibility   Quadriceps  Lt 125 deg, Rt 125 deg      Palpation   SI assessment   Rt sacral torsion, Rt PSIS slightly higher, Lt leg appears longer in supine, Lt ASIS higher than Rt    Palpation comment  no tenderness in bilat iliopsoas, nor PSIS      Special Tests   Other special tests  SLR (+) Lt at 40 deg         [facility] Adult PT Treatment/Exercise - 11/10/17 0001      Lumbar Exercises: Stretches   Passive Hamstring Stretch  Right;Left;2 reps;30 seconds supine with strap     Prone on Elbows Stretch  1 rep;20 seconds    Quad Stretch  Left;2 reps;30 seconds prone with strap     Piriformis  Stretch  Left;2 reps;30 seconds supine travell     Other Lumbar Stretch Exercise  trial of seated Lt hamstring stretch; unable to tolerate, must lean back when LLE straight.       Lumbar Exercises: Aerobic   Nustep  L5: 5 min       Lumbar Exercises: Seated   Sit to Stand  -- 1 rep with core engaged.     Other Seated Lumbar Exercises  Sitting posture - educated pt on neutral pelvis and pt reported decrease in Lt hip symptoms.       Moist Heat Therapy   Number Minutes Moist Heat  15 Minutes    Moist Heat Location  Lumbar Spine      Electrical Stimulation   Electrical Stimulation Location  bilat Lumbar spiine; Lt posterior hip     Electrical Stimulation Action  IFC    Electrical Stimulation Parameters  to tolerance     Electrical Stimulation Goals  Tone;Pain      Manual Therapy  Manual Therapy  Soft tissue mobilization;Muscle Energy Technique    Soft tissue mobilization  to Lt glute/ hip rotators.     Muscle Energy Technique  MET to correct Rt sacral torsion with pt in prone with contract relax of Rt hip rotators; MET to correct posteriorly rotated Lt ilium with pt in prone, RLE off edge of elevated table - contract relax of Lt hip flexors.              PT Education - 11/10/17 1201    Education provided  Yes    Education Details  Pt educated on hip and spine anatomy and how it relates to posture, symptoms, and body mechanics.     Person(s) Educated  Patient    Methods  Explanation;Verbal cues    Comprehension  Verbalized understanding          PT Long Term Goals - 11/10/17 1202      PT LONG TERM GOAL #1   Title  Improve trunk and LE moblity and ROM to WFL's without pain 12/20/17    Time  6    Period  Weeks    Status  On-going      PT LONG TERM GOAL #2   Title  Increase Lt LE strength to 5-/5 to 5/5 without pain with testing 12/20/17    Time  6    Period  Weeks    Status  On-going      PT LONG TERM GOAL #3   Title  Decrease pain by 50-75% allowing patient to  participate normal functional activities 12/20/17    Time  6    Period  Weeks    Status  On-going      PT LONG TERM GOAL #4   Title  Independent in HEP 12/20/17    Time  6    Period  Weeks    Status  On-going      PT LONG TERM GOAL #5   Title  Improve FOTO to </= 23% limitation 12/20/17    Time  6    Period  Weeks    Status  On-going            Plan - 11/10/17 1208    Clinical Impression Statement  Pt reporting increased Lt buttock symptoms when sitting; observed this happens when he is sacral sitting.  Pt reported reduction of symptoms when sitting with netural pelvis.  Pt continues to have limited Lt hamstring flexibility and intolerance for hamstring stretch.  Asymmetries noted in pelvis; some improvement with MET corrections.  Progressing towards goals.    Rehab Potential  Good    PT Frequency  2x / week    PT Duration  6 weeks    PT Treatment/Interventions  Patient/family education;ADLs/Self Care Home Management;Cryotherapy;Electrical Stimulation;Iontophoresis '4mg'$ /ml Dexamethasone;Moist Heat;Traction;Ultrasound;Dry needling;Manual techniques;Neuromuscular re-education;Therapeutic activities;Therapeutic exercise    PT Next Visit Plan  Add core stabilization.  Assess pelvis; MET corrections. Cont spine care education.     Consulted and Agree with Plan of Care  Patient       Patient will benefit from skilled therapeutic intervention in order to improve the following deficits and impairments:  Postural dysfunction, Improper body mechanics, Pain, Increased fascial restricitons, Increased muscle spasms, Decreased mobility, Decreased range of motion, Decreased strength, Decreased activity tolerance  Visit Diagnosis: Acute left-sided low back pain, with sciatica presence unspecified  Other symptoms and signs involving the musculoskeletal system  Muscle weakness (generalized)     Problem List Patient Active Problem List  Diagnosis Date Noted  . Spondylolisthesis at L5-S1  level, with left L5 radiculitis 10/28/2017   Kerin Perna, PTA 11/10/17 12:15 PM  [facility] [place], [place], [address] Phone: [phone]   Fax:  [phone]  Name: Manuel Thompson MRN: [id number] Date of Birth: 31-Aug-1986

## 2017-11-15 ENCOUNTER — Ambulatory Visit: Payer: BLUE CROSS/BLUE SHIELD | Admitting: Physical Therapy

## 2017-11-15 DIAGNOSIS — R29898 Other symptoms and signs involving the musculoskeletal system: Secondary | ICD-10-CM | POA: Diagnosis not present

## 2017-11-15 DIAGNOSIS — M545 Low back pain: Secondary | ICD-10-CM

## 2017-11-15 DIAGNOSIS — M6281 Muscle weakness (generalized): Secondary | ICD-10-CM | POA: Diagnosis not present

## 2017-11-15 NOTE — Therapy (Signed)
[place], [place], [address] Phone: [phone]   Fax:  [phone]  Physical Therapy Treatment  Patient Details  Name: Manuel Thompson MRN: [id number] Date of Birth: 10-May-1986 Referring Provider: Dr. Dianah Field   Encounter Date: 11/15/2017  PT End of Session - 11/15/17 0729    Visit Number  3    Number of Visits  12    Date for PT Re-Evaluation  12/20/17    PT Start Time  0727    PT Stop Time  0820    PT Time Calculation (min)  53 min       No past medical history on file.  No past surgical history on file.  There were no vitals filed for this visit.  Subjective Assessment - 11/15/17 0730    Subjective  Pt reports he is having less pain with sitting, but has pain with transitions (like out of the car).  He has been doing stretches daily.  He states he feels more stable standing on LLE now and that his glute doesn't feel as small.      Patient Stated Goals  get full ROM back in the Lt LE and get rid of pain     Currently in Pain?  Yes    Pain Score  3     Pain Location  Buttocks    Pain Orientation  Right    Pain Descriptors / Indicators  Tightness;Aching    Aggravating Factors   transitioning out of car, trying to stretch hamstring    Pain Relieving Factors  ice and stretching hip.          [facility] PT Assessment - 11/15/17 0001      Assessment   Medical Diagnosis  lumbar radiculitis; Lt LE pain     Referring Provider  Dr. Dianah Field    Onset Date/Surgical Date  08/05/17    Hand Dominance  Left    Next MD Visit  6 weeks         [facility] Adult PT Treatment/Exercise - 11/15/17 0001      Lumbar Exercises: Stretches   Passive Hamstring Stretch  Left;Limitations;20 seconds;4 reps    Passive Hamstring Stretch Limitations  limited tolerance; tried seated, supine, and supine with assist from PTA.     Prone on Elbows Stretch  1 rep;20 seconds    Quad Stretch  Left;2 reps;30 seconds     Piriformis Stretch  Left;3 reps 45 sec hold      Lumbar Exercises: Aerobic   Elliptical  L2: 5 min       Moist Heat Therapy   Number Minutes Moist Heat  15 Minutes    Moist Heat Location  Lumbar Spine      Electrical Stimulation   Electrical Stimulation Location  Posterior Lt hip    Electrical Stimulation Action  IFC    Electrical Stimulation Parameters   to tolerance     Electrical Stimulation Goals  Pain;Tone      Manual Therapy   Soft tissue mobilization  to Lt glute/ hip rotators, deep pressure with active ER/IR of LE.     Muscle Energy Technique  MET to correct Rt sacral torsion with pt in prone with contract relax of Rt hip rotators; MET to correct posteriorly rotated Lt ilium with pt in prone, RLE off edge of elevated table - contract relax of Lt hip flexors, 2 rounds of 3 reps  PT Long Term Goals - 11/15/17 0945      PT LONG TERM GOAL #1   Title  Improve trunk and LE moblity and ROM to WFL's without pain 12/20/17    Time  6    Period  Weeks    Status  On-going      PT LONG TERM GOAL #2   Title  Increase Lt LE strength to 5-/5 to 5/5 without pain with testing 12/20/17    Time  6    Period  Weeks    Status  On-going      PT LONG TERM GOAL #3   Title  Decrease pain by 50-75% allowing patient to participate normal functional activities 12/20/17    Time  6    Period  Weeks    Status  On-going improving      PT LONG TERM GOAL #4   Title  Independent in HEP 12/20/17    Time  6    Period  Weeks    Status  On-going      PT LONG TERM GOAL #5   Title  Improve FOTO to </= 23% limitation 12/20/17    Time  6    Period  Weeks    Status  On-going            Plan - 11/15/17 0941    Clinical Impression Statement  Pt reporting reduced symptoms in Lt hip/low back.  Pt unable to tolerate any hamstring stretching in any positions dur to increased pain at sacrum.  Pt's Lt ilium posteriorly rotated; mild improvement with MET corrections.   Encouraged pt to continue stretches, walking, and being mindful of posture in sitting. PRogressing towards goals.     Rehab Potential  Good    PT Frequency  2x / week    PT Duration  6 weeks    PT Treatment/Interventions  Patient/family education;ADLs/Self Care Home Management;Cryotherapy;Electrical Stimulation;Iontophoresis '4mg'$ /ml Dexamethasone;Moist Heat;Traction;Ultrasound;Dry needling;Manual techniques;Neuromuscular re-education;Therapeutic activities;Therapeutic exercise    PT Next Visit Plan  Assess pelvis; MET corrections. Cont spine care education.     Consulted and Agree with Plan of Care  Patient       Patient will benefit from skilled therapeutic intervention in order to improve the following deficits and impairments:  Postural dysfunction, Improper body mechanics, Pain, Increased fascial restricitons, Increased muscle spasms, Decreased mobility, Decreased range of motion, Decreased strength, Decreased activity tolerance  Visit Diagnosis: Acute left-sided low back pain, with sciatica presence unspecified  Other symptoms and signs involving the musculoskeletal system  Muscle weakness (generalized)     Problem List Patient Active Problem List   Diagnosis Date Noted  . Spondylolisthesis at L5-S1 level, with left L5 radiculitis 10/28/2017   Kerin Perna, PTA 11/15/17 9:55 AM  [facility], [place], [address] Phone: [phone]   Fax:  [phone]  Name: Manuel Thompson MRN: [id number] Date of Birth: January 27, 1986

## 2017-11-17 ENCOUNTER — Encounter: Payer: Self-pay | Admitting: Physical Therapy

## 2017-11-17 ENCOUNTER — Ambulatory Visit: Payer: BLUE CROSS/BLUE SHIELD | Admitting: Physical Therapy

## 2017-11-17 DIAGNOSIS — M6281 Muscle weakness (generalized): Secondary | ICD-10-CM

## 2017-11-17 DIAGNOSIS — M545 Low back pain: Secondary | ICD-10-CM | POA: Diagnosis not present

## 2017-11-17 DIAGNOSIS — R29898 Other symptoms and signs involving the musculoskeletal system: Secondary | ICD-10-CM | POA: Diagnosis not present

## 2017-11-17 NOTE — Therapy (Signed)
[place], [place], [address] Phone: [phone]   Fax:  [phone]  Physical Therapy Treatment  Patient Details  Name: Manuel Thompson MRN: [id number] Date of Birth: 1986/07/15 Referring Provider: Dr. Dianah Field   Encounter Date: 11/17/2017  PT End of Session - 11/17/17 0737    Visit Number  4    Number of Visits  12    Date for PT Re-Evaluation  12/20/17    PT Start Time  0726    PT Stop Time  0820    PT Time Calculation (min)  54 min    Activity Tolerance  Patient tolerated treatment well;No increased pain    Behavior During Therapy  [facility] for tasks assessed/performed       History reviewed. No pertinent past medical history.  History reviewed. No pertinent surgical history.  There were no vitals filed for this visit.  Subjective Assessment - 11/17/17 0900    Subjective  Manuel Thompson reports he now has lumbar support in car.  He is having less pain with transition out of car. He is noticing he can straighten his LLE a little more in sitting than he could 2 days ago.     Patient Stated Goals  get full ROM back in the Lt LE and get rid of pain     Currently in Pain?  No/denies    Pain Score  0-No pain         [facility] PT Assessment - 11/17/17 0001      Assessment   Medical Diagnosis  lumbar radiculitis; Lt LE pain     Referring Provider  Dr. Dianah Field    Onset Date/Surgical Date  08/05/17    Hand Dominance  Left    Next MD Visit  6 weeks       Flexibility   Quadriceps  Lt and Rt 134 deg       Palpation   SI assessment   Rt PSIS higher than Lt. Rt ilium highter than Lt. Lt leg appears longer;  Lt ASIS higher than Rt.           [facility] Adult PT Treatment/Exercise - 11/17/17 0001      Lumbar Exercises: Stretches   Passive Hamstring Stretch  Right;Left;2 reps;30 seconds    Quad Stretch  Left;2 reps;30 seconds;Right      Lumbar Exercises: Aerobic   Elliptical  L2: 5 min       Lumbar Exercises: Supine   Bridge  20 reps;3 seconds      Lumbar Exercises: Prone   Opposite Arm/Leg Raise  Right arm/Left leg;Left arm/Right leg;10 reps;2 seconds      Moist Heat Therapy   Number Minutes Moist Heat  15 Minutes    Moist Heat Location  Other (comment) Lt hamstring      Electrical Stimulation   Electrical Stimulation Location  Lt hamstring and glute    Electrical Stimulation Action  IFC    Electrical Stimulation Parameters   to tolerance     Electrical Stimulation Goals  Pain;Tone      Manual Therapy   Muscle Energy Technique  MET to correct Rt elevated ilium in Rt sidelying;  MET to correct posteriorly rotated Lt ilium with pt in prone, RLE off edge of elevated table - contract relax of Lt hip flexors, 2 rounds of 4 reps        PT Long Term Goals - 11/15/17 0945      PT LONG TERM GOAL #1  Title  Improve trunk and LE moblity and ROM to WFL's without pain 12/20/17    Time  6    Period  Weeks    Status  On-going      PT LONG TERM GOAL #2   Title  Increase Lt LE strength to 5-/5 to 5/5 without pain with testing 12/20/17    Time  6    Period  Weeks    Status  On-going      PT LONG TERM GOAL #3   Title  Decrease pain by 50-75% allowing patient to participate normal functional activities 12/20/17    Time  6    Period  Weeks    Status  On-going improving      PT LONG TERM GOAL #4   Title  Independent in HEP 12/20/17    Time  6    Period  Weeks    Status  On-going      PT LONG TERM GOAL #5   Title  Improve FOTO to </= 23% limitation 12/20/17    Time  6    Period  Weeks    Status  On-going            Plan - 11/17/17 1610    Clinical Impression Statement  Pt reporting reduced symptoms in Lt LLE with transitions now that he is more aware of posture and core.  Pt has gained 10 deg of quad flexibility; hamstrings remain very tight.  Pt making gradual progress towards goals.     Rehab Potential  Good    PT Frequency  2x / week    PT Duration  6 weeks     PT Treatment/Interventions  Patient/family education;ADLs/Self Care Home Management;Cryotherapy;Electrical Stimulation;Iontophoresis '4mg'$ /ml Dexamethasone;Moist Heat;Traction;Ultrasound;Dry needling;Manual techniques;Neuromuscular re-education;Therapeutic activities;Therapeutic exercise    PT Next Visit Plan  Assess pelvis; MET corrections. Cont spine care education.     Consulted and Agree with Plan of Care  Patient       Patient will benefit from skilled therapeutic intervention in order to improve the following deficits and impairments:  Postural dysfunction, Improper body mechanics, Pain, Increased fascial restricitons, Increased muscle spasms, Decreased mobility, Decreased range of motion, Decreased strength, Decreased activity tolerance  Visit Diagnosis: Acute left-sided low back pain, with sciatica presence unspecified  Other symptoms and signs involving the musculoskeletal system  Muscle weakness (generalized)     Problem List Patient Active Problem List   Diagnosis Date Noted  . Spondylolisthesis at L5-S1 level, with left L5 radiculitis 10/28/2017   Manuel Thompson, Manuel Thompson 11/17/17 9:02 AM  [place], [place], [address] Phone: [phone]   Fax:  [phone]  Name: Manuel Thompson MRN: [id number] Date of Birth: [birth year]

## 2017-11-22 ENCOUNTER — Ambulatory Visit: Payer: BLUE CROSS/BLUE SHIELD | Admitting: Physical Therapy

## 2017-11-22 DIAGNOSIS — R29898 Other symptoms and signs involving the musculoskeletal system: Secondary | ICD-10-CM

## 2017-11-22 DIAGNOSIS — M6281 Muscle weakness (generalized): Secondary | ICD-10-CM

## 2017-11-22 DIAGNOSIS — M545 Low back pain: Secondary | ICD-10-CM | POA: Diagnosis not present

## 2017-11-22 NOTE — Therapy (Signed)
[place], [place], [address] Phone: [phone]   Fax:  [phone]  Physical Therapy Treatment  Patient Details  Name: Manuel Thompson MRN: [id number] Date of Birth: 1986-01-07 Referring Provider: Dr. Dianah Field   Encounter Date: 11/22/2017  PT End of Session - 11/22/17 0730    Visit Number  5    Number of Visits  12    Date for PT Re-Evaluation  12/20/17    PT Start Time  0726 pt arrived late    PT Stop Time  0835    PT Time Calculation (min)  69 min    Behavior During Therapy  [facility] for tasks assessed/performed       No past medical history on file.  No past surgical history on file.  There were no vitals filed for this visit.  Subjective Assessment - 11/22/17 0730    Subjective  Pt reports his Lt glute "grabbed" this morning and really slowed him down.  After he got out of his car, his Lt lateral glute spasmed.  He reports he is unable to look over left shoulder for driving, due to increased pain in glute.      Patient Stated Goals  get full ROM back in the Lt LE and get rid of pain     Currently in Pain?  Yes    Pain Score  2     Pain Location  Buttocks    Pain Orientation  Left    Aggravating Factors   transitioning out of car, trying to stretch hamstring    Pain Relieving Factors  ice, and certain stretches.          [facility] PT Assessment - 11/22/17 0001      Assessment   Medical Diagnosis  lumbar radiculitis; Lt LE pain     Referring Provider  Dr. Dianah Field    Onset Date/Surgical Date  08/05/17    Hand Dominance  Left    Next MD Visit  6 weeks       Flexibility   Hamstrings  Lt 50 deg.       Palpation   SI assessment   Lt ASIS higher than Rt. Rt sacral torsion; elevated Lt ilium       [facility] Adult PT Treatment/Exercise - 11/22/17 0001      Lumbar Exercises: Stretches   Piriformis Stretch  Left;Limitations;4 reps 45 sec hold      Lumbar Exercises: Aerobic    Elliptical  L2: 5 min       Moist Heat Therapy   Number Minutes Moist Heat  20 Minutes    Moist Heat Location  Other (comment);Lumbar Spine Lt hamstring      Electrical Stimulation   Electrical Stimulation Location  Lt hamstring and glute    Electrical Stimulation Action  IFC    Electrical Stimulation Parameters  to tolerance    Electrical Stimulation Goals  Tone;Pain      Manual Therapy   Manual Therapy  Myofascial release;Soft tissue mobilization;Muscle Energy Technique    Soft tissue mobilization  STM to Lt QL and lumbar paraspinals, Lt hamstring, Lt calf.     Myofascial Release  MFR to Lt QL and lumbar paraspinals.     Muscle Energy Technique  MET to correct Rt sacral torsion with pt in prone with contract relax of Rt hip rotators; MET to correct posteriorly rotated Lt ilium with pt in prone, RLE off edge of elevated table - contract relax of Lt  hip flexors, 2 rounds of 3 reps             PT Education - 11/22/17 0755    Education provided  Yes    Education Details  DN info    Person(s) Educated  Patient    Methods  Explanation;Handout    Comprehension  Verbalized understanding          PT Long Term Goals - 11/15/17 0945      PT LONG TERM GOAL #1   Title  Improve trunk and LE moblity and ROM to WFL's without pain 12/20/17    Time  6    Period  Weeks    Status  On-going      PT LONG TERM GOAL #2   Title  Increase Lt LE strength to 5-/5 to 5/5 without pain with testing 12/20/17    Time  6    Period  Weeks    Status  On-going      PT LONG TERM GOAL #3   Title  Decrease pain by 50-75% allowing patient to participate normal functional activities 12/20/17    Time  6    Period  Weeks    Status  On-going improving      PT LONG TERM GOAL #4   Title  Independent in HEP 12/20/17    Time  6    Period  Weeks    Status  On-going      PT LONG TERM GOAL #5   Title  Improve FOTO to </= 23% limitation 12/20/17    Time  6    Period  Weeks    Status  On-going             Plan - 11/22/17 0751    Clinical Impression Statement  Pt's Lt ilium continues to be rotated posteriorly; continued tightness in Lt hamstring. Muscular tigthness in Lt>Rt in lumbar paraspinals, QL; muscular tightness in Lt midhamstring down to calf. Trial of DN performed by Gillermo Murdoch, PT.  Slight improvement in Lt hamstring flexibility following DN.  Pt making gradual progress towards goals.     Rehab Potential  Good    PT Frequency  2x / week    PT Duration  6 weeks    PT Treatment/Interventions  Patient/family education;ADLs/Self Care Home Management;Cryotherapy;Electrical Stimulation;Iontophoresis 17m/ml Dexamethasone;Moist Heat;Traction;Ultrasound;Dry needling;Manual techniques;Neuromuscular re-education;Therapeutic activities;Therapeutic exercise    PT Next Visit Plan  assess response to DN, and assess pelvis alignment.     Consulted and Agree with Plan of Care  Patient       Patient will benefit from skilled therapeutic intervention in order to improve the following deficits and impairments:  Postural dysfunction, Improper body mechanics, Pain, Increased fascial restricitons, Increased muscle spasms, Decreased mobility, Decreased range of motion, Decreased strength, Decreased activity tolerance  Visit Diagnosis: Acute left-sided low back pain, with sciatica presence unspecified  Other symptoms and signs involving the musculoskeletal system  Muscle weakness (generalized)     Problem List Patient Active Problem List   Diagnosis Date Noted  . Spondylolisthesis at L5-S1 level, with left L5 radiculitis 10/28/2017   JKerin Perna PTA 11/22/17 8:29 AM  Celyn P. HHelene KelpPT, MPH 11/22/17 8:56 AM    [facility] [address]Phone: [phone]  Fax:  [phone] Name: Manuel BeylMRN: [id number] of Birth: 110-31-1987

## 2017-11-22 NOTE — Patient Instructions (Signed)

## 2017-11-24 ENCOUNTER — Encounter: Payer: Self-pay | Admitting: Rehabilitative and Restorative Service Providers"

## 2017-11-24 ENCOUNTER — Ambulatory Visit: Payer: BLUE CROSS/BLUE SHIELD | Admitting: Rehabilitative and Restorative Service Providers"

## 2017-11-24 DIAGNOSIS — M545 Low back pain: Secondary | ICD-10-CM | POA: Diagnosis not present

## 2017-11-24 DIAGNOSIS — R29898 Other symptoms and signs involving the musculoskeletal system: Secondary | ICD-10-CM

## 2017-11-24 DIAGNOSIS — M6281 Muscle weakness (generalized): Secondary | ICD-10-CM

## 2017-11-24 NOTE — Therapy (Signed)
[facility] [address] [place], [place], [address] Phone: [phone]   Fax:  [phone]  Physical Therapy Treatment  Patient Details  Name: Manuel Thompson MRN: [id number] Date of Birth: 02/20/1986 Referring Provider: Dr. Benjamin Stainhekkekandam   Encounter Date: 11/24/2017  PT End of Session - 11/24/17 0723    Visit Number  6    Number of Visits  12    Date for PT Re-Evaluation  12/20/17    PT Start Time  0722    PT Stop Time  0812    PT Time Calculation (min)  50 min       History reviewed. No pertinent past medical history.  History reviewed. No pertinent surgical history.  There were no vitals filed for this visit.  Subjective Assessment - 11/24/17 0724    Subjective  Lt Glut grabs him when he gets out of his car. He can tell that his ROM is increasing. His pain is a "little bit better" overall since beginning therapy.     Currently in Pain?  Yes    Pain Score  2     Pain Location  Buttocks    Pain Orientation  Left    Pain Descriptors / Indicators  Tightness;Aching    Pain Type  Acute pain                      [facility] Adult PT Treatment/Exercise - 11/24/17 0001      Lumbar Exercises: Stretches   Passive Hamstring Stretch  Left;3 reps;30 seconds    Prone on Elbows Stretch  1 rep;60 seconds improved HS stretch post prone prop    Piriformis Stretch  Left;Limitations;4 reps 45 sec hold    Figure 4 Stretch  3 reps;30 seconds    Figure 4 Stretch Limitations  increased HS mobility with some decreased pain with figure 4 stretch     Other Lumbar Stretch Exercise  cat cow 5 sec hold x5 within pain range       Lumbar Exercises: Aerobic   Elliptical  L2: 4 min       Moist Heat Therapy   Number Minutes Moist Heat  20 Minutes    Moist Heat Location  Other (comment);Lumbar Spine;Hip Lt hamstring      Electrical Stimulation   Electrical Stimulation Location  lumbar spine; Lt glut; Lt HS     Electrical  Stimulation Action  IFC    Electrical Stimulation Parameters  to tolerance    Electrical Stimulation Goals  Tone;Pain      Manual Therapy   Manual therapy comments  pt prone    Joint Mobilization  CPA and lateral mobs through the lumbar spine     Soft tissue mobilization  working through MotorolaLt lumbar paraspinals and QL into Lt glut/HS             PT Education - 11/24/17 0806    Education provided  Yes    Education Details  HEP     Person(s) Educated  Patient    Methods  Explanation;Demonstration;Tactile cues;Verbal cues;Handout    Comprehension  Verbalized understanding;Returned demonstration;Verbal cues required;Tactile cues required          PT Long Term Goals - 11/15/17 0945      PT LONG TERM GOAL #1   Title  Improve trunk and LE moblity and ROM to WFL's without pain 12/20/17    Time  6    Period  Weeks    Status  On-going      PT LONG TERM GOAL #2   Title  Increase Lt LE strength to 5-/5 to 5/5 without pain with testing 12/20/17    Time  6    Period  Weeks    Status  On-going      PT LONG TERM GOAL #3   Title  Decrease pain by 50-75% allowing patient to participate normal functional activities 12/20/17    Time  6    Period  Weeks    Status  On-going improving      PT LONG TERM GOAL #4   Title  Independent in HEP 12/20/17    Time  6    Period  Weeks    Status  On-going      PT LONG TERM GOAL #5   Title  Improve FOTO to </= 23% limitation 12/20/17    Time  6    Period  Weeks    Status  On-going            Plan - 11/24/17 0727    Clinical Impression Statement  Patient tolerated DN with minimal soreness. Continued hamstring tightness and decreased trunk mobility. He has some increase in hamstring flexibility but continues to have limited rangs/mobility and pain into the Lt buttocks with HS stretch in supine. He has hypomobility through the lumbar spine with mobs.     Rehab Potential  Good    PT Frequency  2x / week    PT Duration  6 weeks    PT  Treatment/Interventions  Patient/family education;ADLs/Self Care Home Management;Cryotherapy;Electrical Stimulation;Iontophoresis 4mg /ml Dexamethasone;Moist Heat;Traction;Ultrasound;Dry needling;Manual techniques;Neuromuscular re-education;Therapeutic activities;Therapeutic exercise    PT Next Visit Plan  continue with DN, spinal mobs, manual work as indicated, and assess pelvis alignment. Assess response to spinal mobs and additioinal stretches     Consulted and Agree with Plan of Care  Patient       Patient will benefit from skilled therapeutic intervention in order to improve the following deficits and impairments:  Postural dysfunction, Improper body mechanics, Pain, Increased fascial restricitons, Increased muscle spasms, Decreased mobility, Decreased range of motion, Decreased strength, Decreased activity tolerance  Visit Diagnosis: Acute left-sided low back pain, with sciatica presence unspecified  Other symptoms and signs involving the musculoskeletal system  Muscle weakness (generalized)     Problem List Patient Active Problem List   Diagnosis Date Noted  . Spondylolisthesis at L5-S1 level, with left L5 radiculitis 10/28/2017    Manuel Thompson PT, MPH  11/24/2017, 8:07 AM  [facility] [address] [place] [address] [place], [place], [address] Phone: [phone]   Fax:  [phone]  Name: Manuel Thompson MRN: [id number] Date of Birth: Jan 23, 1986

## 2017-11-24 NOTE — Patient Instructions (Signed)
Cat / Cow Flow    Inhale, press spine toward ceiling like a Halloween cat. Keeping strength in arms and abdominals, exhale to soften spine through neutral and into cow pose. Open chest and arch back. Initiate movement between cat and cow at tailbone, one vertebrae at a time. 5 sec hold Repeat _5___ times.  Figure 4 stretch  30-45 sec x 3 reps

## 2017-11-29 ENCOUNTER — Ambulatory Visit: Payer: BLUE CROSS/BLUE SHIELD | Admitting: Physical Therapy

## 2017-11-29 DIAGNOSIS — M545 Low back pain: Secondary | ICD-10-CM | POA: Diagnosis not present

## 2017-11-29 DIAGNOSIS — M6281 Muscle weakness (generalized): Secondary | ICD-10-CM

## 2017-11-29 DIAGNOSIS — R29898 Other symptoms and signs involving the musculoskeletal system: Secondary | ICD-10-CM | POA: Diagnosis not present

## 2017-11-29 NOTE — Therapy (Signed)
[facility] [address] [place], [place], [address] Phone: [phone]   Fax:  [phone]  Physical Therapy Treatment  Patient Details  Name: Manuel Thompson MRN: [id number] Date of Birth: 03/23/1986 Referring Provider: Dr. Benjamin Stainhekkekandam   Encounter Date: 11/29/2017  PT End of Session - 11/29/17 0743    Visit Number  7    Number of Visits  12    Date for PT Re-Evaluation  12/20/17    PT Start Time  0723 pt arrived late    PT Stop Time  0819    PT Time Calculation (min)  56 min    Activity Tolerance  Patient tolerated treatment well    Behavior During Therapy  [facility] for tasks assessed/performed       No past medical history on file.  No past surgical history on file.  There were no vitals filed for this visit.  Subjective Assessment - 11/29/17 0744    Subjective  Pt reports he is having less pain.  He bent over to pick something up and he had a zing all the way down his leg. He still has trouble getting Lt leg up out of tub/shower due to tightness in back of Lt leg.     Patient Stated Goals  get full ROM back in the Lt LE and get rid of pain     Currently in Pain?  No/denies    Pain Score  0-No pain just "tightness" in back of LLE         [facility] - 11/29/17 0001      Assessment   Medical Diagnosis  lumbar radiculitis; Lt LE pain     Referring Provider  Dr. Benjamin Stainhekkekandam    Onset Date/Surgical Date  08/05/17    Hand Dominance  Left    Next MD Visit  6 weeks       Flexibility   Hamstrings  Lt 51 deg.       [facility] Adult PT Treatment/Exercise - 11/29/17 0001      Lumbar Exercises: Stretches   Passive Hamstring Stretch  Left;3 reps;30 seconds supine with strap    Prone on Elbows Stretch  60 seconds;2 reps    Figure 4 Stretch  3 reps;30 seconds    Other Lumbar Stretch Exercise  cat cow 5 sec hold, within painfree zone x 5 reps each way.       Lumbar Exercises: Aerobic   Elliptical  L2: 5 min        Lumbar Exercises: Seated   Other Seated Lumbar Exercises  sitting on dynadisc:  pelvic tilts ant/post, side to side, CW/CCW pain increased with side tilt (open to Lt) and circles      Lumbar Exercises: Supine   Bridge  10 reps slow, segmental      Moist Heat Therapy   Number Minutes Moist Heat  15 Minutes    Moist Heat Location  Other (comment);Lumbar Spine;Hip Lt hamstring      Electrical Stimulation   Electrical Stimulation Location  lumbar spine; Lt glut; Lt HS     Electrical Stimulation Action  IFC    Electrical Stimulation Parameters   to tolerance     Electrical Stimulation Goals  Tone;Pain      Manual Therapy   Manual therapy comments  pt prone    Soft tissue mobilization  STM working through [employer]olaLt lumbar paraspinals and QL into Lt glut/HS  PT Long Term Goals - 11/15/17 0945      PT LONG TERM GOAL #1   Title  Improve trunk and LE moblity and ROM to WFL's without pain 12/20/17    Time  6    Period  Weeks    Status  On-going      PT LONG TERM GOAL #2   Title  Increase Lt LE strength to 5-/5 to 5/5 without pain with testing 12/20/17    Time  6    Period  Weeks    Status  On-going      PT LONG TERM GOAL #3   Title  Decrease pain by 50-75% allowing patient to participate normal functional activities 12/20/17    Time  6    Period  Weeks    Status  On-going improving      PT LONG TERM GOAL #4   Title  Independent in HEP 12/20/17    Time  6    Period  Weeks    Status  On-going      PT LONG TERM GOAL #5   Title  Improve FOTO to </= 23% limitation 12/20/17    Time  6    Period  Weeks    Status  On-going            Plan - 11/29/17 0807    Clinical Impression Statement  Pt reporting less radicular pain with transitional movements. His Lt hamstring remains very tight - 50 deg.  He had increased LLE radicular symptoms with seated pelvic tilt to Lt and circles while sitting dynadisc; resolved with rest.  Pt making gradual progress towards  established goals.     Rehab Potential  Good    PT Frequency  2x / week    PT Duration  6 weeks    PT Treatment/Interventions  Patient/family education;ADLs/Self Care Home Management;Cryotherapy;Electrical Stimulation;Iontophoresis 4mg /ml Dexamethasone;Moist Heat;Traction;Ultrasound;Dry needling;Manual techniques;Neuromuscular re-education;Therapeutic activities;Therapeutic exercise    PT Next Visit Plan  continue with DN, spinal mobs, manual work as indicated, and assess pelvis alignment.     Consulted and Agree with Plan of Care  Patient       Patient will benefit from skilled therapeutic intervention in order to improve the following deficits and impairments:  Postural dysfunction, Improper body mechanics, Pain, Increased fascial restricitons, Increased muscle spasms, Decreased mobility, Decreased range of motion, Decreased strength, Decreased activity tolerance  Visit Diagnosis: Acute left-sided low back pain, with sciatica presence unspecified  Other symptoms and signs involving the musculoskeletal system  Muscle weakness (generalized)     Problem List Patient Active Problem List   Diagnosis Date Noted  . Spondylolisthesis at L5-S1 level, with left L5 radiculitis 10/28/2017   Mayer Camel, PTA 11/29/17 10:03 AM  [facility] [place] [address] [place], [place], [address] Phone: [phone]   Fax:  [phone]  Name: Manuel Thompson MRN: [id number] Date of Birth: Apr 12, 1986

## 2017-12-01 ENCOUNTER — Ambulatory Visit: Payer: BLUE CROSS/BLUE SHIELD | Admitting: Rehabilitative and Restorative Service Providers"

## 2017-12-01 ENCOUNTER — Encounter: Payer: Self-pay | Admitting: Rehabilitative and Restorative Service Providers"

## 2017-12-01 DIAGNOSIS — M6281 Muscle weakness (generalized): Secondary | ICD-10-CM | POA: Diagnosis not present

## 2017-12-01 DIAGNOSIS — M545 Low back pain: Secondary | ICD-10-CM | POA: Diagnosis not present

## 2017-12-01 DIAGNOSIS — R29898 Other symptoms and signs involving the musculoskeletal system: Secondary | ICD-10-CM | POA: Diagnosis not present

## 2017-12-01 NOTE — Therapy (Signed)
[place], [place], [address] Phone: [phone]   Fax:  [phone]  Physical Therapy Treatment  Patient Details  Name: Manuel Thompson MRN: [id number] Date of Birth: 19-Sep-1986 Referring Provider: Dr. Dianah Field   Encounter Date: 12/01/2017  PT End of Session - 12/01/17 0725    Visit Number  8    Number of Visits  12    Date for PT Re-Evaluation  12/20/17    PT Start Time  0724    PT Stop Time  0820    PT Time Calculation (min)  56 min    Activity Tolerance  Patient tolerated treatment well       History reviewed. No pertinent past medical history.  History reviewed. No pertinent surgical history.  There were no vitals filed for this visit.  Subjective Assessment - 12/01/17 0726    Subjective  Patient reports that he is about 65-70% improved overall since beginning therapy. Symptoms have been present since November. Still having some tightness and pain in the posterior lateral Lt thigh. No longer having symptoms into the Lt foot. .    Currently in Pain?  Yes    Pain Score  2     Pain Location  Leg    Pain Orientation  Left    Pain Descriptors / Indicators  Tightness;Aching    Pain Type  Acute pain    Pain Radiating Towards  Lt posterior lateral thigh                       [facility] Adult PT Treatment/Exercise - 12/01/17 0001      Lumbar Exercises: Stretches   Passive Hamstring Stretch  Left;3 reps;30 seconds supine with strap    Passive Hamstring Stretch Limitations  sitting on raised surface with good spinal extension with LE extended  - difficult prior to DN - pain free with increased excursion post DN     Prone on Elbows Stretch  60 seconds;2 reps    Other Lumbar Stretch Exercise  cat cow 5 sec x 10 pain free       Lumbar Exercises: Aerobic   Elliptical  L2: 5 min       Lumbar Exercises: Seated   Other Seated Lumbar Exercises  sitting on elevated surface working on  anterior peovic tilt       Moist Heat Therapy   Number Minutes Moist Heat  20 Minutes    Moist Heat Location  Other (comment);Lumbar Spine;Hip Lt hamstring      Electrical Stimulation   Electrical Stimulation Location  lumbar spine; Lt glut; Lt HS     Electrical Stimulation Action  IFC    Electrical Stimulation Parameters  to tolerance    Electrical Stimulation Goals  Tone;Pain      Manual Therapy   Manual therapy comments  pt prone    Joint Mobilization  CPA and lateral mobs through the lumbar spine     Soft tissue mobilization  Lt lumbar/QL/ gluts/piriformis/lateral hamstring     Myofascial Release  lumbar spine                   PT Long Term Goals - 12/01/17 0725      PT LONG TERM GOAL #1   Title  Improve trunk and LE moblity and ROM to WFL's without pain 12/20/17    Time  6    Period  Weeks    Status  On-going  PT LONG TERM GOAL #2   Title  Increase Lt LE strength to 5-/5 to 5/5 without pain with testing 12/20/17    Time  6    Period  Weeks    Status  On-going      PT LONG TERM GOAL #3   Title  Decrease pain by 50-75% allowing patient to participate normal functional activities 12/20/17    Time  6    Period  Weeks    Status  Partially Met      PT LONG TERM GOAL #4   Title  Independent in HEP 12/20/17    Time  6    Period  Weeks    Status  On-going      PT LONG TERM GOAL #5   Title  Improve FOTO to </= 23% limitation 12/20/17    Time  6    Period  Weeks    Status  On-going            Plan - 12/01/17 0815    Clinical Impression Statement  Good response to DN and manual work followed by work on segmental lumbar spine mobility. Elevated surface for spinal mobility to allow movement without radicular symptoms. Decreased Lt LE symptoms post treatment. Patient reports that he feels less tightness and discomfort/pulling.     Rehab Potential  Good    PT Frequency  2x / week    PT Duration  6 weeks    PT Treatment/Interventions  Patient/family  education;ADLs/Self Care Home Management;Cryotherapy;Electrical Stimulation;Iontophoresis 57m/ml Dexamethasone;Moist Heat;Traction;Ultrasound;Dry needling;Manual techniques;Neuromuscular re-education;Therapeutic activities;Therapeutic exercise    PT Next Visit Plan  continue with DN, spinal mobs, manual work as indicated, and assess pelvis alignment.     Consulted and Agree with Plan of Care  Patient       Patient will benefit from skilled therapeutic intervention in order to improve the following deficits and impairments:  Postural dysfunction, Improper body mechanics, Pain, Increased fascial restricitons, Increased muscle spasms, Decreased mobility, Decreased range of motion, Decreased strength, Decreased activity tolerance  Visit Diagnosis: Acute left-sided low back pain, with sciatica presence unspecified  Other symptoms and signs involving the musculoskeletal system  Muscle weakness (generalized)     Problem List Patient Active Problem List   Diagnosis Date Noted  . Spondylolisthesis at L5-S1 level, with left L5 radiculitis 10/28/2017    Jentry Mcqueary PNilda SimmerPT, MPH  12/01/2017, 8:20 AM  [facility] [address]Phone: [phone]  Fax:  [phone] Name: Manuel GalluzzoMRN: [id number] of Birth: 1Jan 05, 1987

## 2017-12-06 ENCOUNTER — Encounter: Payer: Self-pay | Admitting: Physical Therapy

## 2017-12-06 ENCOUNTER — Ambulatory Visit: Payer: BLUE CROSS/BLUE SHIELD | Admitting: Physical Therapy

## 2017-12-06 DIAGNOSIS — R29898 Other symptoms and signs involving the musculoskeletal system: Secondary | ICD-10-CM

## 2017-12-06 DIAGNOSIS — M545 Low back pain: Secondary | ICD-10-CM | POA: Diagnosis not present

## 2017-12-06 DIAGNOSIS — M6281 Muscle weakness (generalized): Secondary | ICD-10-CM | POA: Diagnosis not present

## 2017-12-06 NOTE — Therapy (Signed)
[employer] [place], [place], [address] Phone: [phone]   Fax:  [phone]  Physical Therapy Treatment  Patient Details  Name: Manuel Thompson MRN: [id number] Date of Birth: [birth year] Referring Provider: Dr. Dianah Thompson   Encounter Date: 12/06/2017  PT End of Session - 12/06/17 0722    Visit Number  9    Number of Visits  12    Date for PT Re-Evaluation  12/20/17    PT Start Time  0718    PT Stop Time  0820    PT Time Calculation (min)  62 min    Activity Tolerance  Patient tolerated treatment well;No increased pain    Behavior During Therapy  Manuel Thompson for tasks assessed/performed       History reviewed. No pertinent past medical history.  History reviewed. No pertinent surgical history.  There were no vitals filed for this visit.  Subjective Assessment - 12/06/17 0722    Subjective  Pt reports he drove 1.5 hr over weekend and he had some increased symptoms into his leg/foot (30 min into drive). It was irritating and throbbing.  He took Advil and went to bed and it has resolved.     Patient Stated Goals  get full ROM back in the Lt LE and get rid of pain     Currently in Pain?  No/denies    Pain Score  -- "just tightness behind knee"         [facility] PT Assessment - 12/06/17 0001      Assessment   Medical Diagnosis  lumbar radiculitis; Lt LE pain     Referring Provider  Dr. Dianah Thompson    Onset Date/Surgical Date  08/05/17    Hand Dominance  Left    Next MD Visit  6 weeks       Observation/Other Assessments   Focus on Therapeutic Outcomes (FOTO)   28% limited (goal of 23% limted)      Strength   Left Hip Extension  4+/5      Flexibility   Hamstrings  Lt 50 deg.  Supine SLR with strap for stretch         [facility] Adult PT Treatment/Exercise - 12/06/17 0001      Lumbar Exercises: Stretches   Passive Hamstring Stretch  Left;30 seconds;3 reps    Passive Hamstring Stretch Limitations   sitting on raised surface with VC good spinal extension with LE extended .  LE nerve glide with LLE with pumping foot x 20 sec.      Prone on Elbows Stretch  60 seconds;2 reps    Piriformis Stretch  Left;3 reps;60 seconds      Lumbar Exercises: Aerobic   Elliptical  L2.5: 5 min       Lumbar Exercises: Supine   Bridge  10 reps slow, segmental    Bridge with March  10 reps 2 sets      Lumbar Exercises: Prone   Single Leg Raise  Left;Right;10 reps;2 seconds      Lumbar Exercises: Quadruped   [place]/Old Horse  5 reps      Moist Heat Therapy   Number Minutes Moist Heat  20 Minutes    Moist Heat Location  Other (comment);Lumbar Spine;Hip Lt hamstring      Electrical Stimulation   Electrical Stimulation Location  lumbar spine; Lt glut; Lt HS     Electrical Stimulation Action  IFC    Electrical Stimulation Parameters  to tolerance    Electrical Stimulation  Goals  Tone;Pain      Manual Therapy   Manual therapy comments  pt prone    Soft tissue mobilization  Lt lumbar/QL/ gluts/piriformis/lateral hamstring     Muscle Energy Technique  MET to correct Rt sacral torsion with pt in prone with contract relax of Rt hip rotators                  PT Long Term Goals - 12/01/17 0725      PT LONG TERM GOAL #1   Title  Improve trunk and LE moblity and ROM to WFL's without pain 12/20/17    Time  6    Period  Weeks    Status  On-going      PT LONG TERM GOAL #2   Title  Increase Lt LE strength to 5-/5 to 5/5 without pain with testing 12/20/17    Time  6    Period  Weeks    Status  On-going      PT LONG TERM GOAL #3   Title  Decrease pain by 50-75% allowing patient to participate normal functional activities 12/20/17    Time  6    Period  Weeks    Status  Partially Met      PT LONG TERM GOAL #4   Title  Independent in HEP 12/20/17    Time  6    Period  Weeks    Status  On-going      PT LONG TERM GOAL #5   Title  Improve FOTO to </= 23% limitation 12/20/17    Time  6     Period  Weeks    Status  On-going            Plan - 12/06/17 0736    Clinical Impression Statement  Pt had easier time with LLE stretches today; less radicular symptoms each visit. Lt hamstring flex 50 deg with less pain.  Lt hip ext strength improving.  FOTO score has improved to 28%. Progressing towards goals.     Rehab Potential  Good    PT Frequency  2x / week    PT Duration  6 weeks    PT Treatment/Interventions  Patient/family education;ADLs/Self Care Home Management;Cryotherapy;Electrical Stimulation;Iontophoresis '4mg'$ /ml Dexamethasone;Moist Heat;Traction;Ultrasound;Dry needling;Manual techniques;Neuromuscular re-education;Therapeutic activities;Therapeutic exercise    PT Next Visit Plan  continue with DN, spinal mobs, manual work as indicated, and assess pelvis alignment.     Consulted and Agree with Plan of Care  Patient       Patient will benefit from skilled therapeutic intervention in order to improve the following deficits and impairments:  Postural dysfunction, Improper body mechanics, Pain, Increased fascial restricitons, Increased muscle spasms, Decreased mobility, Decreased range of motion, Decreased strength, Decreased activity tolerance  Visit Diagnosis: Acute left-sided low back pain, with sciatica presence unspecified  Other symptoms and signs involving the musculoskeletal system  Muscle weakness (generalized)     Problem List Patient Active Problem List   Diagnosis Date Noted  . Spondylolisthesis at L5-S1 level, with left L5 radiculitis 10/28/2017   Kerin Perna, PTA 12/06/17 8:04 AM  [place], [place], [address] Phone: [phone]   Fax:  [phone]  Name: Manuel Thompson MRN: [id number] Date of Birth: 05/15/1986

## 2017-12-08 ENCOUNTER — Ambulatory Visit: Payer: BLUE CROSS/BLUE SHIELD | Admitting: Physical Therapy

## 2017-12-08 DIAGNOSIS — R29898 Other symptoms and signs involving the musculoskeletal system: Secondary | ICD-10-CM

## 2017-12-08 DIAGNOSIS — M545 Low back pain: Secondary | ICD-10-CM | POA: Diagnosis not present

## 2017-12-08 DIAGNOSIS — M6281 Muscle weakness (generalized): Secondary | ICD-10-CM | POA: Diagnosis not present

## 2017-12-08 NOTE — Therapy (Signed)
[place], [place], [address] Phone: [phone]   Fax:  [phone]  Physical Therapy Treatment  Patient Details  Name: Manuel Thompson MRN: [id number] Date of Birth: [birth year] Referring Provider: Dr. Dianah Field   Encounter Date: 12/08/2017  PT End of Session - 12/08/17 0816    Visit Number  10    Number of Visits  12    Date for PT Re-Evaluation  12/20/17    PT Start Time  0720    PT Stop Time  0819    PT Time Calculation (min)  59 min    Behavior During Therapy  [facility] for tasks assessed/performed       No past medical history on file.  No past surgical history on file.  There were no vitals filed for this visit.  Subjective Assessment - 12/08/17 0726    Subjective  Elpidio reports he continues to have some tightness behind Lt knee with transitions, "Once I get good and moving, it goes away".  He hasn't had any "grabbing spasms" in buttocks in a week.  He took extra seat cushion out of car and he thinks that has helped a little.     Currently in Pain?  No/denies    Pain Score  0-No pain         [facility] PT Assessment - 12/08/17 0001      Assessment   Medical Diagnosis  lumbar radiculitis; Lt LE pain     Referring Provider  Dr. Dianah Field    Onset Date/Surgical Date  08/05/17    Hand Dominance  Left    Next MD Visit  12/20/17       [facility] Adult PT Treatment/Exercise - 12/08/17 0001      Lumbar Exercises: Stretches   Passive Hamstring Stretch  30 seconds;3 reps;Left;Right    Passive Hamstring Stretch Limitations  sitting on raised surface with VC good spinal extension with LE extended .  LE nerve glide with LLE with pumping foot x 20 sec.      Piriformis Stretch  Right;Left;4 reps;30 seconds      Lumbar Exercises: Aerobic   Elliptical  L3: 5 min  PTA present to discuss progress; VC for even pattern with LE      Lumbar Exercises: Seated   Sit to Stand  10 reps with repeated cues  for neutral pelvis    Other Seated Lumbar Exercises  sitting on elevated surface working on anterior pelvic tilt       Lumbar Exercises: Supine   Other Supine Lumbar Exercises  LLE nerve glide with hip flex 90 deg and pt flex/extend knee to tolerance x 12       Modalities   Modalities  Electrical Stimulation;Moist Heat;Ultrasound      Moist Heat Therapy   Number Minutes Moist Heat  15 Minutes    Moist Heat Location  -- Lt hamstring      Electrical Stimulation   Electrical Stimulation Location  lumbar spine; Lt glut; Lt HS     Electrical Stimulation Action  IFC and combo [place].    Electrical Stimulation Parameters  to tolerance     Electrical Stimulation Goals  Tone;Pain      Ultrasound   Ultrasound Location  Lt distal lateral hamstring; Lt QL    Ultrasound Parameters  combo [place]; 100%, 1.3 w/cm2, 9 min total.     Ultrasound Goals  Pain tone      Manual Therapy   Manual therapy comments  pt prone    Soft tissue mobilization  Lt lumbar/QL/ gluts/piriformis/lateral hamstring                   PT Long Term Goals - 12/01/17 0725      PT LONG TERM GOAL #1   Title  Improve trunk and LE moblity and ROM to WFL's without pain 12/20/17    Time  6    Period  Weeks    Status  On-going      PT LONG TERM GOAL #2   Title  Increase Lt LE strength to 5-/5 to 5/5 without pain with testing 12/20/17    Time  6    Period  Weeks    Status  On-going      PT LONG TERM GOAL #3   Title  Decrease pain by 50-75% allowing patient to participate normal functional activities 12/20/17    Time  6    Period  Weeks    Status  Partially Met      PT LONG TERM GOAL #4   Title  Independent in HEP 12/20/17    Time  6    Period  Weeks    Status  On-going      PT LONG TERM GOAL #5   Title  Improve FOTO to </= 23% limitation 12/20/17    Time  6    Period  Weeks    Status  On-going            Plan - 12/08/17 0816    Clinical Impression Statement  Pt reported slight decrease in nerve pain  with nerve flossing.  He reported increased LLE with pelvic tilts (especially post tilt).  Encouraged pt to avoid this position and had him practice sit to stand with neutral pelvis; this was painfree.   Pt making gradual progress towards remaining goals.    Rehab Potential  Good    PT Frequency  2x / week    PT Duration  6 weeks    PT Treatment/Interventions  Patient/family education;ADLs/Self Care Home Management;Cryotherapy;Electrical Stimulation;Iontophoresis 55m/ml Dexamethasone;Moist Heat;Traction;Ultrasound;Dry needling;Manual techniques;Neuromuscular re-education;Therapeutic activities;Therapeutic exercise    PT Next Visit Plan  continue with DN, spinal mobs, manual work as indicated.     Consulted and Agree with Plan of Care  Patient       Patient will benefit from skilled therapeutic intervention in order to improve the following deficits and impairments:  Postural dysfunction, Improper body mechanics, Pain, Increased fascial restricitons, Increased muscle spasms, Decreased mobility, Decreased range of motion, Decreased strength, Decreased activity tolerance  Visit Diagnosis: Acute left-sided low back pain, with sciatica presence unspecified  Other symptoms and signs involving the musculoskeletal system  Muscle weakness (generalized)     Problem List Patient Active Problem List   Diagnosis Date Noted  . Spondylolisthesis at L5-S1 level, with left L5 radiculitis 10/28/2017   JKerin Perna PTA 12/08/17 10:52 AM  [address] [place] [address]Phone: [phone]  Fax:  [phone] Name: Manuel RowserMRN: [id number] of Birth: 112/25/1987

## 2017-12-13 ENCOUNTER — Encounter: Payer: BLUE CROSS/BLUE SHIELD | Admitting: Rehabilitative and Restorative Service Providers"

## 2017-12-14 ENCOUNTER — Ambulatory Visit: Payer: BLUE CROSS/BLUE SHIELD | Admitting: Rehabilitative and Restorative Service Providers"

## 2017-12-14 DIAGNOSIS — M545 Low back pain: Secondary | ICD-10-CM | POA: Diagnosis not present

## 2017-12-14 DIAGNOSIS — M6281 Muscle weakness (generalized): Secondary | ICD-10-CM

## 2017-12-14 DIAGNOSIS — R29898 Other symptoms and signs involving the musculoskeletal system: Secondary | ICD-10-CM | POA: Diagnosis not present

## 2017-12-14 NOTE — Therapy (Signed)
[place], [place], [address] Phone: [phone]   Fax:  [phone]  Physical Therapy Treatment  Patient Details  Name: Manuel Thompson MRN: [id number] Date of Birth: 1985-12-13 Referring Provider: Dr Dianah Field    Encounter Date: 12/14/2017  PT End of Session - 12/14/17 1019    Visit Number  11    Number of Visits  12    Date for PT Re-Evaluation  12/20/17    PT Start Time  1017    PT Stop Time  1115    PT Time Calculation (min)  58 min    Activity Tolerance  Patient tolerated treatment well       No past medical history on file.  No past surgical history on file.  There were no vitals filed for this visit.  Subjective Assessment - 12/14/17 1020    Subjective  Continued improvement with less pain and aching. Can now get in and out of the car without pain. Mobility through [place] is increasing.     Currently in Pain?  No/denies         [facility] PT Assessment - 12/14/17 0001      Assessment   Medical Diagnosis  lumbar radiculitis; Lt LE pain     Referring Provider  Dr Dianah Field     Onset Date/Surgical Date  08/05/17    Hand Dominance  Left    Next MD Visit  12/20/17      Flexibility   Hamstrings  Lt 58 deg; 63 deg post DN  supine with strap       Palpation   Spinal mobility  tightness with CPA mobs lumbar spine greatest at L4/5    Palpation comment  tightness through the QL and lumbar paraspinals Lt                   [facility] Adult PT Treatment/Exercise - 12/14/17 0001      Lumbar Exercises: Stretches   Passive Hamstring Stretch  30 seconds;3 reps;Left;Right    Prone on Elbows Stretch  60 seconds;1 rep    Press Ups  -- 1-2 sec pause; 10 reps painfree range     Quad Stretch  Left;2 reps;30 seconds;Right    Piriformis Stretch  Right;Left;4 reps;30 seconds      Lumbar Exercises: Aerobic   Elliptical  L3: 5 min  PTA present to discuss progress; VC for even pattern  with LE      Lumbar Exercises: Supine   Other Supine Lumbar Exercises  LLE nerve glide with hip flex 90 deg and pt flex/extend knee to tolerance x 8      Lumbar Exercises: Quadruped   Madcat/Old Horse  10 reps      Moist Heat Therapy   Number Minutes Moist Heat  20 Minutes    Moist Heat Location  Lumbar Spine Lt hamstring       Electrical Stimulation   Electrical Stimulation Location  lumbar spine; Lt glut; Lt HS     Electrical Stimulation Action  IFC    Electrical Stimulation Parameters  to tolerance    Electrical Stimulation Goals  Tone;Pain      Manual Therapy   Manual therapy comments  pt prone    Soft tissue mobilization  Lt lumbar/QL/ gluts/piriformis/lateral hamstring     Myofascial Release  Lt lumbar        Trigger Point Dry Needling - 12/14/17 1050    Consent Given?  Yes  Muscles Treated Lower Body  -- Lt with estim     Longissimus Response  Palpable increased muscle length Lt L3/4/5                PT Long Term Goals - 12/01/17 0725      PT LONG TERM GOAL #1   Title  Improve trunk and LE moblity and ROM to WFL's without pain 12/20/17    Time  6    Period  Weeks    Status  On-going      PT LONG TERM GOAL #2   Title  Increase Lt LE strength to 5-/5 to 5/5 without pain with testing 12/20/17    Time  6    Period  Weeks    Status  On-going      PT LONG TERM GOAL #3   Title  Decrease pain by 50-75% allowing patient to participate normal functional activities 12/20/17    Time  6    Period  Weeks    Status  Partially Met      PT LONG TERM GOAL #4   Title  Independent in HEP 12/20/17    Time  6    Period  Weeks    Status  On-going      PT LONG TERM GOAL #5   Title  Improve FOTO to </= 23% limitation 12/20/17    Time  6    Period  Weeks    Status  On-going            Plan - 12/14/17 1052    Clinical Impression Statement  Continued progress with current treatment plan. Working on spinal mobility and ROM as well as neural mobilization through  the [place]. Good response to DN with 5 deg gain in HS extensibility following DN and manual work. Progressing well toward stated goals of therapy. Will benefit from continued PT.     Rehab Potential  Good    PT Frequency  2x / week    PT Duration  6 weeks    PT Treatment/Interventions  Patient/family education;ADLs/Self Care Home Management;Cryotherapy;Electrical Stimulation;Iontophoresis '4mg'$ /ml Dexamethasone;Moist Heat;Traction;Ultrasound;Dry needling;Manual techniques;Neuromuscular re-education;Therapeutic activities;Therapeutic exercise    PT Next Visit Plan  continue with DN, spinal mobs, manual work as indicated.     Consulted and Agree with Plan of Care  Patient       Patient will benefit from skilled therapeutic intervention in order to improve the following deficits and impairments:  Postural dysfunction, Improper body mechanics, Pain, Increased fascial restricitons, Increased muscle spasms, Decreased mobility, Decreased range of motion, Decreased strength, Decreased activity tolerance  Visit Diagnosis: Acute left-sided low back pain, with sciatica presence unspecified  Other symptoms and signs involving the musculoskeletal system  Muscle weakness (generalized)     Problem List Patient Active Problem List   Diagnosis Date Noted  . Spondylolisthesis at L5-S1 level, with left L5 radiculitis 10/28/2017    Shakinah Navis Nilda Simmer PT, MPH  12/14/2017, 10:58 AM  [facility] [place], [place], [address] Phone: [phone]   Fax:  [phone]  Name: Manuel Thompson MRN: [id number] Date of Birth: 01-23-1986

## 2017-12-16 ENCOUNTER — Ambulatory Visit: Payer: BLUE CROSS/BLUE SHIELD | Admitting: Physical Therapy

## 2017-12-16 DIAGNOSIS — M6281 Muscle weakness (generalized): Secondary | ICD-10-CM | POA: Diagnosis not present

## 2017-12-16 DIAGNOSIS — R29898 Other symptoms and signs involving the musculoskeletal system: Secondary | ICD-10-CM

## 2017-12-16 DIAGNOSIS — M545 Low back pain: Secondary | ICD-10-CM

## 2017-12-16 NOTE — Therapy (Signed)
[name], [place], [address] Phone: [phone]   Fax:  [phone]  Physical Therapy Treatment  Patient Details  Name: Manuel Thompson MRN: [id number] Date of Birth: 1986-08-10 Referring Provider: Dr. Dianah Field   Encounter Date: 12/16/2017  PT End of Session - 12/16/17 0808    Visit Number  12    Date for PT Re-Evaluation  12/20/17    PT Start Time  0806 pt arrived late    PT Stop Time  0859    PT Time Calculation (min)  53 min    Activity Tolerance  Patient tolerated treatment well    Behavior During Therapy  [facility] for tasks assessed/performed       No past medical history on file.  No past surgical history on file.  There were no vitals filed for this visit.  Subjective Assessment - 12/16/17 0808    Subjective  Pt reports he did not have any pain getting out of car this morning.  He reports 80% improvement since starting therapy.  He continues to be limited with sitting and he is still unable to bike.  He still has pain in back with sneezing and coughing.     Patient Stated Goals  get full ROM back in the Lt LE and get rid of pain     Currently in Pain?  No/denies    Pain Score  0-No pain         [facility] PT Assessment - 12/16/17 0001      Assessment   Medical Diagnosis  lumbar radiculitis; Lt LE pain     Referring Provider  Dr. Dianah Field    Onset Date/Surgical Date  08/05/17    Hand Dominance  Left    Next MD Visit  12/20/17      Observation/Other Assessments   Focus on Therapeutic Outcomes (FOTO)   25% limited (goal of 23%)      AROM   Lumbar Flexion  fingertips to knees; tightness in buttocks.     Lumbar Extension  WNL    Lumbar - Right Side Bend  WNL    Lumbar - Left Side Bend  WNL    Lumbar - Right Rotation  80%    Lumbar - Left Rotation  90%      Strength   Left Hip Extension  -- 5-/5      Flexibility   Hamstrings  55 deg LLE with supine stretch.       Palpation   SI assessment   Rt sacral torsion        [facility] Adult PT Treatment/Exercise - 12/16/17 0001      Lumbar Exercises: Stretches   Passive Hamstring Stretch  30 seconds;3 reps;Left;Right    Press Ups  -- 1-2 sec pause; 10 reps painfree range     Quad Stretch  Left;2 reps;30 seconds;Right    Piriformis Stretch  Right;Left;30 seconds;2 reps    Gastroc Stretch  Right;Left;2 reps;30 seconds      Lumbar Exercises: Aerobic   Elliptical  L3: 5 min  PTA present to discuss progress      Lumbar Exercises: Supine   Other Supine Lumbar Exercises  LLE nerve glide with hip flex 90 deg and pt flex/extend knee to tolerance x 8      Lumbar Exercises: Quadruped   Madcat/Old Horse  10 reps    Straight Leg Raise  5 reps;3 seconds Tactile / VC for level pelvis    Opposite Arm/Leg Raise  Right arm/Left leg;Left arm/Right leg;5 reps      Moist Heat Therapy   Number Minutes Moist Heat  15 Minutes    Moist Heat Location  Lumbar Spine Lt hamstring       Electrical Stimulation   Electrical Stimulation Location  lumbar spine; Lt glut; Lt HS     Electrical Stimulation Action  TENS    Electrical Stimulation Parameters  to tolerance     Electrical Stimulation Goals  Tone;Pain      Manual Therapy   Manual therapy comments  pt prone    Soft tissue mobilization  Lt lumbar/QL/ gluts/piriformis/lateral hamstring     Muscle Energy Technique  MET to correct Rt sacral torsion with pt in prone with contract relax of Rt hip rotators                  PT Long Term Goals - 12/16/17 0810      PT LONG TERM GOAL #1   Title  Improve trunk and LE moblity and ROM to WFL's without pain 12/20/17    Time  6    Period  Weeks    Status  Partially Met      PT LONG TERM GOAL #2   Title  Increase Lt LE strength to 5-/5 to 5/5 without pain with testing 12/20/17    Time  6    Period  Weeks    Status  On-going      PT LONG TERM GOAL #3   Title  Decrease pain by 50-75% allowing patient to participate  normal functional activities 12/20/17    Time  6    Period  Weeks    Status  Achieved      PT LONG TERM GOAL #4   Title  Independent in HEP 12/20/17    Time  6    Period  Weeks    Status  On-going      PT LONG TERM GOAL #5   Title  Improve FOTO to </= 23% limitation 12/20/17    Time  6    Period  Weeks    Status  On-going            Plan - 12/16/17 8527    Clinical Impression Statement  Pt had positive response to DN last session with noted improvement of hamstring flexibility.  Pt demonstrated improved lumbar mobility and hip ext strength. He continues with very limited lumbar flexion at this time. He has had gradual progress; reporting 80% improvement.  Pt will benefit from continued PT intervention to maximize functional mobility.     Rehab Potential  Good    PT Frequency  2x / week    PT Duration  6 weeks    PT Treatment/Interventions  Patient/family education;ADLs/Self Care Home Management;Cryotherapy;Electrical Stimulation;Iontophoresis 90m/ml Dexamethasone;Moist Heat;Traction;Ultrasound;Dry needling;Manual techniques;Neuromuscular re-education;Therapeutic activities;Therapeutic exercise    PT Next Visit Plan  spoke with supervising PT; will request additional visits from MD.  continue spinal stabilization and manual therapy.     Consulted and Agree with Plan of Care  Patient       Patient will benefit from skilled therapeutic intervention in order to improve the following deficits and impairments:  Postural dysfunction, Improper body mechanics, Pain, Increased fascial restricitons, Increased muscle spasms, Decreased mobility, Decreased range of motion, Decreased strength, Decreased activity tolerance  Visit Diagnosis: Acute left-sided low back pain, with sciatica presence unspecified  Other symptoms and signs involving the musculoskeletal system  Muscle weakness (generalized)  Problem List Patient Active Problem List   Diagnosis Date Noted  . Spondylolisthesis  at L5-S1 level, with left L5 radiculitis 10/28/2017   Manuel Thompson, PTA 12/16/17 8:47 AM   Manuel Thompson PT, MPH 12/16/17 8:58 AM   .[facility] [place], [place], [address] Phone: [phone]   Fax:  [phone]  Name: Manuel Thompson MRN: [id number] Date of Birth: 1985-10-22

## 2017-12-20 ENCOUNTER — Encounter: Payer: Self-pay | Admitting: Sports Medicine

## 2017-12-20 ENCOUNTER — Ambulatory Visit: Payer: BLUE CROSS/BLUE SHIELD | Admitting: Physical Therapy

## 2017-12-20 ENCOUNTER — Ambulatory Visit (INDEPENDENT_AMBULATORY_CARE_PROVIDER_SITE_OTHER): Payer: BLUE CROSS/BLUE SHIELD | Admitting: Sports Medicine

## 2017-12-20 DIAGNOSIS — M6281 Muscle weakness (generalized): Secondary | ICD-10-CM | POA: Diagnosis not present

## 2017-12-20 DIAGNOSIS — R29898 Other symptoms and signs involving the musculoskeletal system: Secondary | ICD-10-CM

## 2017-12-20 DIAGNOSIS — M545 Low back pain: Secondary | ICD-10-CM

## 2017-12-20 DIAGNOSIS — M4317 Spondylolisthesis, lumbosacral region: Secondary | ICD-10-CM | POA: Diagnosis not present

## 2017-12-20 NOTE — Progress Notes (Signed)
  Subjective:    CC: Follow-up low back pain:  HPI: Nearly resolved with aggressive physical therapy, he did have L5-S1 degenerative disc disease with spondylolisthesis and bilateral pars defects.  No more radicular symptoms, no bowel or bladder dysfunction, saddle numbness, constitutional symptoms.  I reviewed the past medical history, family history, social history, surgical history, and allergies today and no changes were needed.  Please see the problem list section below in epic for further details.  Past Medical History: No past medical history on file. Past Surgical History: No past surgical history on file. Social History: Social History   Socioeconomic History  . Marital status: Single    Spouse name: None  . Number of children: None  . Years of education: None  . Highest education level: None  Social Needs  . Financial resource strain: None  . Food insecurity - worry: None  . Food insecurity - inability: None  . Transportation needs - medical: None  . Transportation needs - non-medical: None  Occupational History  . None  Tobacco Use  . Smoking status: Never Smoker  . Smokeless tobacco: Never Used  Substance and Sexual Activity  . Alcohol use: Yes    Comment: 1-2 q wk  . Drug use: No  . Sexual activity: None  Other Topics Concern  . None  Social History Narrative  . None   Family History: No family history on file. Allergies: Allergies  Allergen Reactions  . Amoxicillin Rash and Other (See Comments)    Childhood allergy. Questions if his throat may have swollen.    Medications: See med rec.  Review of Systems: No fevers, chills, night sweats, weight loss, chest pain, or shortness of breath.   Objective:    General: Well Developed, well nourished, and in no acute distress.  Neuro: Alert and oriented x3, extra-ocular muscles intact, sensation grossly intact.  HEENT: Normocephalic, atraumatic, pupils equal round reactive to light, neck supple, no  masses, no lymphadenopathy, thyroid nonpalpable.  Skin: Warm and dry, no rashes. Cardiac: Regular rate and rhythm, no murmurs rubs or gallops, no lower extremity edema.  Respiratory: Clear to auscultation bilaterally. Not using accessory muscles, speaking in full sentences. Back Exam:  Inspection: Unremarkable  Motion: Flexion 45 deg, Extension 45 deg, Side Bending to 45 deg bilaterally,  Rotation to 45 deg bilaterally  SLR laying: Negative  XSLR laying: Negative  Palpable tenderness: None. FABER: negative. Sensory change: Gross sensation intact to all lumbar and sacral dermatomes.  Reflexes: 2+ at both patellar tendons, 2+ at achilles tendons, Babinski's downgoing.  Strength at foot  Plantar-flexion: 5/5 Dorsi-flexion: 5/5 Eversion: 5/5 Inversion: 5/5  Leg strength  Quad: 5/5 Hamstring: 5/5 Hip flexor: 5/5 Hip abductors: 5/5  Gait unremarkable. Impression and Recommendations:    Spondylolisthesis at L5-S1 level, with left L5 radiculitis Fantastic improvements with aggressive physical therapy for 2 months now. He would like to get a renewal on his PT prescription. Occasional radicular symptoms with Valsalva, no bowel or bladder dysfunction, saddle numbness, constitutional symptoms. He can return to see me on an as-needed basis. ___________________________________________ Ihor Austinhomas J. Benjamin Stainhekkekandam, M.D., ABFM., CAQSM. Primary Care and Sports Medicine [facility]  Adjunct Instructor of Family Medicine  [employer]

## 2017-12-20 NOTE — Assessment & Plan Note (Signed)
Fantastic improvements with aggressive physical therapy for 2 months now. He would like to get a renewal on his PT prescription. Occasional radicular symptoms with Valsalva, no bowel or bladder dysfunction, saddle numbness, constitutional symptoms. He can return to see me on an as-needed basis.

## 2017-12-20 NOTE — Therapy (Signed)
[place], [place], [address] Phone: [phone]   Fax:  [phone]  Physical Therapy Treatment  Patient Details  Name: Manuel Thompson MRN: [id number] Date of Birth: 07-25-1986 Referring Provider: Dr. Dianah Field   Encounter Date: 12/20/2017  PT End of Session - 12/20/17 0737    Visit Number  13    Number of Visits  24    Date for PT Re-Evaluation  01/29/18    PT Start Time  0725    PT Stop Time  0815    PT Time Calculation (min)  50 min    Activity Tolerance  Patient tolerated treatment well    Behavior During Therapy  [facility] for tasks assessed/performed       No past medical history on file.  No past surgical history on file.  There were no vitals filed for this visit.  Subjective Assessment - 12/20/17 0736    Subjective  Pt reports he hasn't had any spasms or pain in a while.  Just tightness in back of Lt (distal posterior) thigh. He continues to stretch everyday.     Patient Stated Goals  get full ROM back in the Lt LE and get rid of pain     Currently in Pain?  No/denies    Pain Score  0-No pain         [facility] PT Assessment - 12/20/17 0001      Assessment   Medical Diagnosis  lumbar radiculitis; Lt LE pain     Referring Provider  Dr. Dianah Field    Onset Date/Surgical Date  08/05/17    Hand Dominance  Left    Next MD Visit  12/20/17      Flexibility   Hamstrings  55 deg LLE with supine stretch.        [facility] Adult PT Treatment/Exercise - 12/20/17 0001      Lumbar Exercises: Stretches   Passive Hamstring Stretch  30 seconds;3 reps;Left;Right    Gastroc Stretch  Right;Left;2 reps;30 seconds      Lumbar Exercises: Aerobic   Elliptical  L3: 5 min  PTA present to discuss progress      Lumbar Exercises: Quadruped   Madcat/Old Horse  10 reps    Opposite Arm/Leg Raise  Right arm/Left leg;Left arm/Right leg;10 reps;3 seconds improved form    Other Quadruped Lumbar Exercises   childs pose x 20 sec, childs pose with lateral flexion x 20 sec x 2 reps      Moist Heat Therapy   Number Minutes Moist Heat  15 Minutes    Moist Heat Location  Lumbar Spine Lt hamstring       Electrical Stimulation   Electrical Stimulation Location  lumbar spine; Lt glut; Lt HS     Electrical Stimulation Action  IFC    Electrical Stimulation Parameters  to tolerance    Electrical Stimulation Goals  Tone;Pain      Manual Therapy   Manual therapy comments  pt prone    Soft tissue mobilization  Edge tool assistance to decrease fascial restrictions to Lt lateral hamstring and glute max insertion; deep tissue / TPR to same areas.         PT Long Term Goals - 12/20/17 0846      PT LONG TERM GOAL #1   Title  Improve trunk and LE moblity and ROM to WFL's without pain 01/29/18    Time  12    Period  Weeks    Status  Partially Met      PT LONG TERM GOAL #2   Title  Increase Lt LE strength to 5-/5 to 5/5 without pain with testing 01/29/18    Time  12    Period  Weeks    Status  Partially Met      PT LONG TERM GOAL #3   Title  Decrease pain by 50-75% allowing patient to participate normal functional activities 12/20/17    Time  6    Period  Weeks    Status  Achieved      PT LONG TERM GOAL #4   Title  Independent in HEP 01/29/18    Time  12    Period  Weeks    Status  On-going      PT LONG TERM GOAL #5   Title  Improve FOTO to </= 23% limitation 01/29/18    Time  12    Period  Weeks    Status  On-going            Plan - 12/20/17 0800    Clinical Impression Statement  Continued tightness in LLE along glute, lateral hamstring, ITB.  Lt hamstring flexibility slowly improving; 55 deg.  Pt reporting less episodes of pain and.  Pt making gradual gains towards remaining goals.     Rehab Potential  Good    PT Frequency  2x / week    PT Duration  6 weeks    PT Treatment/Interventions  Patient/family education;ADLs/Self Care Home Management;Cryotherapy;Electrical  Stimulation;Iontophoresis 23m/ml Dexamethasone;Moist Heat;Traction;Ultrasound;Dry needling;Manual techniques;Neuromuscular re-education;Therapeutic activities;Therapeutic exercise    PT Next Visit Plan  continue spinal stabilization and manual therapy. DN next visit.     Consulted and Agree with Plan of Care  Patient       Patient will benefit from skilled therapeutic intervention in order to improve the following deficits and impairments:  Postural dysfunction, Improper body mechanics, Pain, Increased fascial restricitons, Increased muscle spasms, Decreased mobility, Decreased range of motion, Decreased strength, Decreased activity tolerance  Visit Diagnosis: Acute left-sided low back pain, with sciatica presence unspecified  Other symptoms and signs involving the musculoskeletal system  Muscle weakness (generalized)     Problem List Patient Active Problem List   Diagnosis Date Noted  . Spondylolisthesis at L5-S1 level, with left L5 radiculitis 10/28/2017   JKerin Perna PTA 12/20/17 8:47 AM  [facility] [address]Phone: [phone]  Fax:  [phone] Name: Manuel GarskeMRN: [id number] of Birth: [birth year]

## 2017-12-27 ENCOUNTER — Ambulatory Visit: Payer: BLUE CROSS/BLUE SHIELD | Admitting: Rehabilitative and Restorative Service Providers"

## 2017-12-27 ENCOUNTER — Encounter: Payer: Self-pay | Admitting: Rehabilitative and Restorative Service Providers"

## 2017-12-27 DIAGNOSIS — M6281 Muscle weakness (generalized): Secondary | ICD-10-CM | POA: Diagnosis not present

## 2017-12-27 DIAGNOSIS — R29898 Other symptoms and signs involving the musculoskeletal system: Secondary | ICD-10-CM | POA: Diagnosis not present

## 2017-12-27 DIAGNOSIS — M545 Low back pain: Secondary | ICD-10-CM | POA: Diagnosis not present

## 2017-12-27 NOTE — Therapy (Signed)
[place], [place], [address] Phone: [phone]   Fax:  [phone]  Physical Therapy Treatment  Patient Details  Name: Manuel Thompson MRN: [id number] Date of Birth: [birth year] Referring Provider: Dr Dianah Field    Encounter Date: 12/27/2017  PT End of Session - 12/27/17 1202    Visit Number  14    Number of Visits  24    Date for PT Re-Evaluation  01/29/18    PT Start Time  8841    PT Stop Time  1255    PT Time Calculation (min)  57 min    Activity Tolerance  Patient tolerated treatment well       History reviewed. No pertinent past medical history.  History reviewed. No pertinent surgical history.  There were no vitals filed for this visit.  Subjective Assessment - 12/27/17 1203    Subjective  DN seems to help. He does not like but it helps. He is still working on his exercises at home. No pain todya May have sat around too much yesterday and had some last night.     Currently in Pain?  No/denies         [facility] PT Assessment - 12/27/17 0001      Assessment   Medical Diagnosis  lumbar radiculitis; Lt LE pain     Referring Provider  Dr Dianah Field     Onset Date/Surgical Date  08/05/17    Hand Dominance  Left    Next MD Visit  12/20/17      Flexibility   Hamstrings  61 deg LLE with supine stretch.       Palpation   Palpation comment  tightness through Lt QL and lumbar paraspinals; piriformis; gluts; IT band; hamstrings             No data recorded       [facility] Adult PT Treatment/Exercise - 12/27/17 0001      Lumbar Exercises: Stretches   Passive Hamstring Stretch  30 seconds;3 reps;Left;Right    ITB Stretch  Left;2 reps;60 seconds sidelying Lt LE off edge of table     Piriformis Stretch  Right;Left;30 seconds;2 reps    Gastroc Stretch  Right;Left;2 reps;30 seconds      Lumbar Exercises: Aerobic   Elliptical  L3: 5 min  PTA present to discuss progress      Lumbar Exercises: Supine   Other Supine Lumbar Exercises  LLE nerve glide with hip flex 90 deg and pt flex/extend knee to tolerance x 8      Lumbar Exercises: Quadruped   Madcat/Old Horse  10 reps    Other Quadruped Lumbar Exercises  childs pose x 20 sec, childs pose with lateral flexion x 20 sec x 2 reps      Moist Heat Therapy   Number Minutes Moist Heat  15 Minutes    Moist Heat Location  Lumbar Spine Lt hamstring       Electrical Stimulation   Electrical Stimulation Location  lumbar spine; Lt glut; Lt HS     Electrical Stimulation Action  IFC    Electrical Stimulation Parameters  to tolerance    Electrical Stimulation Goals  Tone;Pain      Manual Therapy   Manual therapy comments  pt prone    Soft tissue mobilization  Lt lumbar paraspinals; QL; lats; piriformis; gluts; hamstrings        Trigger Point Dry Needling - 12/27/17 1224    Consent Given?  Yes  Longissimus Response  Palpable increased muscle length Lt with estim     Hamstring Response  Palpable increased muscle length    Gastrocnemius Response  Palpable increased muscle length    Soleus Response  Palpable increased muscle length                PT Long Term Goals - 12/27/17 1204      PT LONG TERM GOAL #1   Title  Improve trunk and LE moblity and ROM to WFL's without pain 01/29/18    Time  12    Period  Weeks    Status  Partially Met      PT LONG TERM GOAL #2   Title  Increase Lt LE strength to 5-/5 to 5/5 without pain with testing 01/29/18    Time  12    Period  Weeks    Status  Partially Met      PT LONG TERM GOAL #3   Title  Decrease pain by 50-75% allowing patient to participate normal functional activities 12/20/17    Time  6    Period  Weeks    Status  Achieved      PT LONG TERM GOAL #4   Title  Independent in HEP 01/29/18    Time  12    Period  Weeks    Status  On-going      PT LONG TERM GOAL #5   Title  Improve FOTO to </= 23% limitation 01/29/18    Time  12    Period  Weeks     Status  On-going            Plan - 12/27/17 1250    Clinical Impression Statement  SLR increased to 61 degrees with stretch out strap - has been consistently 55 deg. Good response to DN and manual work with stretching exercises. Progressing well toward stated goals of therapy.     Rehab Potential  Good    PT Frequency  2x / week    PT Treatment/Interventions  Patient/family education;ADLs/Self Care Home Management;Cryotherapy;Electrical Stimulation;Iontophoresis 30m/ml Dexamethasone;Moist Heat;Traction;Ultrasound;Dry needling;Manual techniques;Neuromuscular re-education;Therapeutic activities;Therapeutic exercise    PT Next Visit Plan  continue spinal stabilization and manual therapy. DN as indicated     Consulted and Agree with Plan of Care  Patient       Patient will benefit from skilled therapeutic intervention in order to improve the following deficits and impairments:  Postural dysfunction, Improper body mechanics, Pain, Increased fascial restricitons, Increased muscle spasms, Decreased mobility, Decreased range of motion, Decreased strength, Decreased activity tolerance  Visit Diagnosis: Acute left-sided low back pain, with sciatica presence unspecified  Other symptoms and signs involving the musculoskeletal system  Muscle weakness (generalized)     Problem List Patient Active Problem List   Diagnosis Date Noted  . Spondylolisthesis at L5-S1 level, with left L5 radiculitis 10/28/2017    Manuel PNilda SimmerPT,MPH  12/27/2017, 12:54 PM  [facility] [address]Phone: [phone]  Fax:  [phone] Name: DDaniell MancinasMRN: [id number] of Birth: 126-Oct-1987

## 2017-12-28 ENCOUNTER — Encounter: Payer: BLUE CROSS/BLUE SHIELD | Admitting: Rehabilitative and Restorative Service Providers"

## 2017-12-30 ENCOUNTER — Ambulatory Visit: Payer: BLUE CROSS/BLUE SHIELD | Admitting: Rehabilitative and Restorative Service Providers"

## 2017-12-30 ENCOUNTER — Encounter: Payer: Self-pay | Admitting: Rehabilitative and Restorative Service Providers"

## 2017-12-30 DIAGNOSIS — M545 Low back pain: Secondary | ICD-10-CM

## 2017-12-30 DIAGNOSIS — M6281 Muscle weakness (generalized): Secondary | ICD-10-CM

## 2017-12-30 DIAGNOSIS — R29898 Other symptoms and signs involving the musculoskeletal system: Secondary | ICD-10-CM

## 2017-12-30 NOTE — Therapy (Signed)
[place], [place], [address] Phone: [phone]   Fax:  [phone]  Physical Therapy Treatment  Patient Details  Name: Sricharan Lacomb MRN: [id number] Date of Birth: [birth year] Referring Provider: Dr Dianah Field    Encounter Date: 12/30/2017    History reviewed. No pertinent past medical history.  History reviewed. No pertinent surgical history.  There were no vitals filed for this visit.  Subjective Assessment - 12/30/17 0812    Subjective  Not so much pain now. Can still feel something when he strains and sometimes when he sneezes or coughs.     Currently in Pain?  No/denies                No data recorded       [facility] Adult PT Treatment/Exercise - 12/30/17 0001      Lumbar Exercises: Stretches   Passive Hamstring Stretch  30 seconds;3 reps;Left;Right    Piriformis Stretch  Right;Left;30 seconds;2 reps    Other Lumbar Stretch Exercise  sidelying trunk rotation Rt sidelying rotating trunk back on the Lt 20-30 sec hold x 3 reps       Lumbar Exercises: Aerobic   Elliptical  L3: 5 min  PTA present to discuss progress      Lumbar Exercises: Supine   Other Supine Lumbar Exercises  LLE nerve glide with hip flex 90 deg and pt flex/extend knee to tolerance x 8      Lumbar Exercises: Quadruped   Madcat/Old Horse  5 reps    Other Quadruped Lumbar Exercises  childs pose x 20 sec      Moist Heat Therapy   Number Minutes Moist Heat  15 Minutes    Moist Heat Location  Lumbar Spine Lt hamstring       Electrical Stimulation   Electrical Stimulation Location  lumbar spine; Lt glut; Lt HS     Electrical Stimulation Action  IFC    Electrical Stimulation Parameters  to tolerance    Electrical Stimulation Goals  Tone;Pain      Manual Therapy   Manual therapy comments  pt prone and Rt sideying     Soft tissue mobilization  Lt lumbar paraspinals; QL; lats; piriformis; gluts;  hamstrings; Lt TFL/ITB/lateral calf         Trigger Point Dry Needling - 12/30/17 0825    Consent Given?  Yes    Muscles Treated Lower Body  -- Lt with estim     Gluteus Maximus Response  Palpable increased muscle length    Piriformis Response  Palpable increased muscle length    Tensor Fascia Lata Response  Palpable increased muscle length;Twitch response elicited    Hamstring Response  Palpable increased muscle length                PT Long Term Goals - 12/27/17 1204      PT LONG TERM GOAL #1   Title  Improve trunk and LE moblity and ROM to WFL's without pain 01/29/18    Time  12    Period  Weeks    Status  Partially Met      PT LONG TERM GOAL #2   Title  Increase Lt LE strength to 5-/5 to 5/5 without pain with testing 01/29/18    Time  12    Period  Weeks    Status  Partially Met      PT LONG TERM GOAL #3   Title  Decrease pain by 50-75% allowing  patient to participate normal functional activities 12/20/17    Time  6    Period  Weeks    Status  Achieved      PT LONG TERM GOAL #4   Title  Independent in HEP 01/29/18    Time  12    Period  Weeks    Status  On-going      PT LONG TERM GOAL #5   Title  Improve FOTO to </= 23% limitation 01/29/18    Time  12    Period  Weeks    Status  On-going            Plan - 12/30/17 0813    Clinical Impression Statement  Continues to demonstrate and report porgress with back and LE pain. Progressing with all functional activities.     Rehab Potential  Good    PT Frequency  2x / week    PT Duration  6 weeks    PT Treatment/Interventions  Patient/family education;ADLs/Self Care Home Management;Cryotherapy;Electrical Stimulation;Iontophoresis '4mg'$ /ml Dexamethasone;Moist Heat;Traction;Ultrasound;Dry needling;Manual techniques;Neuromuscular re-education;Therapeutic activities;Therapeutic exercise    PT Next Visit Plan  continue spinal stabilization and manual therapy. DN as indicated     Consulted and Agree with Plan of  Care  Patient       Patient will benefit from skilled therapeutic intervention in order to improve the following deficits and impairments:  Postural dysfunction, Improper body mechanics, Pain, Increased fascial restricitons, Increased muscle spasms, Decreased mobility, Decreased range of motion, Decreased strength, Decreased activity tolerance  Visit Diagnosis: Acute left-sided low back pain, with sciatica presence unspecified  Other symptoms and signs involving the musculoskeletal system  Muscle weakness (generalized)     Problem List Patient Active Problem List   Diagnosis Date Noted  . Spondylolisthesis at L5-S1 level, with left L5 radiculitis 10/28/2017    Celyn Nilda Simmer PT, MPH  12/30/2017, 8:39 AM  [facility] [place], [place], [address] Phone: [phone]   Fax:  [phone]  Name: Xyon Lukasik MRN: [id number] Date of Birth: 04/01/1986

## 2018-01-03 ENCOUNTER — Encounter: Payer: Self-pay | Admitting: Rehabilitative and Restorative Service Providers"

## 2018-01-03 ENCOUNTER — Ambulatory Visit: Payer: BLUE CROSS/BLUE SHIELD | Admitting: Rehabilitative and Restorative Service Providers"

## 2018-01-03 DIAGNOSIS — R29898 Other symptoms and signs involving the musculoskeletal system: Secondary | ICD-10-CM | POA: Diagnosis not present

## 2018-01-03 DIAGNOSIS — M545 Low back pain: Secondary | ICD-10-CM | POA: Diagnosis not present

## 2018-01-03 DIAGNOSIS — M6281 Muscle weakness (generalized): Secondary | ICD-10-CM

## 2018-01-03 NOTE — Therapy (Signed)
[place], [place], [address] Phone: [phone]   Fax:  [phone]  Physical Therapy Treatment  Patient Details  Name: Manuel Thompson MRN: [id number] Date of Birth: Oct 08, 1985 Referring Provider: Dr Dianah Field    Encounter Date: 01/03/2018  PT End of Session - 01/03/18 0812    Visit Number  15    Number of Visits  24    Date for PT Re-Evaluation  01/29/18    PT Start Time  0806    PT Stop Time  0900    PT Time Calculation (min)  54 min    Activity Tolerance  Patient tolerated treatment well       History reviewed. No pertinent past medical history.  History reviewed. No pertinent surgical history.  There were no vitals filed for this visit.  Subjective Assessment - 01/03/18 0807    Subjective  Patient reports that his leg is not throbbing and he knows what to do in sitting and lying down. Tried a stationary bike this weekeknd and did pretty good except when he tried to fully extend his leg.     Currently in Pain?  No/denies         [facility] PT Assessment - 01/03/18 0001      Assessment   Medical Diagnosis  lumbar radiculitis; Lt LE pain     Referring Provider  Dr Dianah Field     Onset Date/Surgical Date  08/05/17    Hand Dominance  Left    Next MD Visit  12/20/17      Flexibility   Hamstrings  65 deg LLE with supine stretch.       Palpation   Palpation comment  tightness through Lt QL and lumbar paraspinals; piriformis; gluts; IT band; hamstrings             No data recorded       [facility] Adult PT Treatment/Exercise - 01/03/18 0001      Lumbar Exercises: Stretches   Press Ups  -- 1-2 sec pause x 10     Piriformis Stretch  Right;Left;30 seconds;2 reps      Lumbar Exercises: Aerobic   Elliptical  L3: 5 min  PTA present to discuss progress      Lumbar Exercises: Supine   Other Supine Lumbar Exercises  LLE nerve glide with hip flex 90 deg and pt flex/extend knee to  tolerance x 8      Lumbar Exercises: Quadruped   Madcat/Old Horse  5 reps    Other Quadruped Lumbar Exercises  childs pose x 20 sec      Moist Heat Therapy   Number Minutes Moist Heat  15 Minutes    Moist Heat Location  Lumbar Spine Lt hamstring       Electrical Stimulation   Electrical Stimulation Location  lumbar spine; Lt glut; Lt HS     Electrical Stimulation Action  IFC    Electrical Stimulation Parameters  to tolerance    Electrical Stimulation Goals  Tone;Pain      Manual Therapy   Manual therapy comments  Pt prone     Soft tissue mobilization  Lt lumbar paraspinals; QL; lats; piriformis; gluts; hamstrings; Lt TFL/ITB/lateral calf         Trigger Point Dry Needling - 01/03/18 0321    Consent Given?  Yes    Muscles Treated Lower Body  -- Lt with estim     Longissimus Response  -- Lt QL - -palpable increase in  tissue extensibility     Gluteus Maximus Response  Palpable increased muscle length    Piriformis Response  Palpable increased muscle length    Hamstring Response  Palpable increased muscle length lateral hamstring                 PT Long Term Goals - 01/03/18 0809      PT LONG TERM GOAL #1   Title  Improve trunk and LE moblity and ROM to WFL's without pain 01/29/18    Time  12    Period  Weeks    Status  Partially Met      PT LONG TERM GOAL #2   Title  Increase Lt LE strength to 5-/5 to 5/5 without pain with testing 01/29/18    Time  12    Period  Weeks    Status  Partially Met      PT LONG TERM GOAL #3   Title  Decrease pain by 50-75% allowing patient to participate normal functional activities 12/20/17    Time  6    Period  Weeks    Status  Achieved      PT LONG TERM GOAL #4   Title  Independent in HEP 01/29/18    Time  12    Period  Weeks    Status  Partially Met      PT LONG TERM GOAL #5   Title  Improve FOTO to </= 23% limitation 01/29/18    Time  12    Period  Weeks    Status  On-going            Plan - 01/03/18 0809     Clinical Impression Statement  Cotninues to progress well toward stated goals of therapy. He has no pain except in extremes of motion. Patient has returned to most functional activities. He has improved tissue extensibility and LE mobility. Excellent progress overall.     Rehab Potential  Good    PT Frequency  2x / week    PT Duration  6 weeks    PT Treatment/Interventions  Patient/family education;ADLs/Self Care Home Management;Cryotherapy;Electrical Stimulation;Iontophoresis 53m/ml Dexamethasone;Moist Heat;Traction;Ultrasound;Dry needling;Manual techniques;Neuromuscular re-education;Therapeutic activities;Therapeutic exercise    PT Next Visit Plan  continue spinal stabilization and manual therapy. DN as indicated        Patient will benefit from skilled therapeutic intervention in order to improve the following deficits and impairments:  Postural dysfunction, Improper body mechanics, Pain, Increased fascial restricitons, Increased muscle spasms, Decreased mobility, Decreased range of motion, Decreased strength, Decreased activity tolerance  Visit Diagnosis: Acute left-sided low back pain, with sciatica presence unspecified  Other symptoms and signs involving the musculoskeletal system  Muscle weakness (generalized)     Problem List Patient Active Problem List   Diagnosis Date Noted  . Spondylolisthesis at L5-S1 level, with left L5 radiculitis 10/28/2017    [name], MPH  01/03/2018, 8:35 AM  [facility] [address]Phone: [phone]  Fax:  [phone] Name: Manuel TylerMRN: [id number] of Birth: 101-20-1987

## 2018-01-05 ENCOUNTER — Encounter: Payer: Self-pay | Admitting: Rehabilitative and Restorative Service Providers"

## 2018-01-05 ENCOUNTER — Ambulatory Visit: Payer: BLUE CROSS/BLUE SHIELD | Admitting: Rehabilitative and Restorative Service Providers"

## 2018-01-05 DIAGNOSIS — M545 Low back pain: Secondary | ICD-10-CM | POA: Diagnosis not present

## 2018-01-05 DIAGNOSIS — M6281 Muscle weakness (generalized): Secondary | ICD-10-CM | POA: Diagnosis not present

## 2018-01-05 DIAGNOSIS — R29898 Other symptoms and signs involving the musculoskeletal system: Secondary | ICD-10-CM | POA: Diagnosis not present

## 2018-01-05 NOTE — Therapy (Signed)
[place], [place], [address] Phone: [phone]   Fax:  [phone]  Physical Therapy Treatment  Patient Details  Name: Manuel Thompson MRN: [id number] Date of Birth: 11-20-1985 Referring Provider: Dr Dianah Field    Encounter Date: 01/05/2018  PT End of Session - 01/05/18 0812    Visit Number  16    Number of Visits  24    Date for PT Re-Evaluation  01/29/18    PT Start Time  0811 pt 11 min late for appt     PT Stop Time  0904    PT Time Calculation (min)  53 min    Activity Tolerance  Patient tolerated treatment well       History reviewed. No pertinent past medical history.  History reviewed. No pertinent surgical history.  There were no vitals filed for this visit.  Subjective Assessment - 01/05/18 0813    Subjective  Tightness along the posterior aspect of the Lt thigh. Feels that is mostly the problem.     Currently in Pain?  No/denies                       [facility] Adult PT Treatment/Exercise - 01/05/18 0001      Lumbar Exercises: Stretches   Passive Hamstring Stretch  30 seconds;3 reps;Left;Right    Passive Hamstring Stretch Limitations  sitting on raised surface with VC good spinal extension with LE extended .  LE nerve glide with LLE with pumping foot x 20 sec.      Press Ups  -- 1-2 sec pause x 10     Piriformis Stretch  Right;Left;30 seconds;2 reps    Gastroc Stretch  Right;Left;2 reps;30 seconds    Gastroc Stretch Limitations  soleus stretch Lt/Rt 30 sec x 2       Lumbar Exercises: Supine   Other Supine Lumbar Exercises  LLE nerve glide with hip flex 90 deg and pt flex/extend knee to tolerance x 8      Lumbar Exercises: Quadruped   [place]/Old Horse  5 reps    Other Quadruped Lumbar Exercises  childs pose x 20 sec      Moist Heat Therapy   Number Minutes Moist Heat  15 Minutes    Moist Heat Location  Lumbar Spine Lt hamstring/calf      Electrical  Stimulation   Electrical Stimulation Location  Lt hamstring and calf     Electrical Stimulation Action  IFC    Electrical Stimulation Parameters  to tolerance    Electrical Stimulation Goals  Tone;Pain      Manual Therapy   Manual therapy comments  Pt prone     Soft tissue mobilization  Lt hamstrings; Lt TFL/ITB/lateral calf         Trigger Point Dry Needling - 01/05/18 0830    Consent Given?  Yes    Muscles Treated Lower Body  -- Lt with estim     Hamstring Response  Palpable increased muscle length;Twitch response elicited    Gastrocnemius Response  Palpable increased muscle length;Twitch response elicited    Soleus Response  Palpable increased muscle length;Twitch response elicited           PT Education - 01/05/18 0833    Education provided  Yes    Education Details  HEP    Person(s) Educated  Patient    Methods  Explanation;Demonstration;Tactile cues;Verbal cues;Handout    Comprehension  Verbalized understanding;Returned demonstration;Verbal cues required;Tactile cues required  PT Long Term Goals - 01/03/18 0809      PT LONG TERM GOAL #1   Title  Improve trunk and LE moblity and ROM to WFL's without pain 01/29/18    Time  12    Period  Weeks    Status  Partially Met      PT LONG TERM GOAL #2   Title  Increase Lt LE strength to 5-/5 to 5/5 without pain with testing 01/29/18    Time  12    Period  Weeks    Status  Partially Met      PT LONG TERM GOAL #3   Title  Decrease pain by 50-75% allowing patient to participate normal functional activities 12/20/17    Time  6    Period  Weeks    Status  Achieved      PT LONG TERM GOAL #4   Title  Independent in HEP 01/29/18    Time  12    Period  Weeks    Status  Partially Met      PT LONG TERM GOAL #5   Title  Improve FOTO to </= 23% limitation 01/29/18    Time  12    Period  Weeks    Status  On-going            Plan - 01/05/18 0816    Clinical Impression Statement  Continued improvement in  symptoms with patient reporting increased activity with decreased symptoms. Progressing well with DN, manual work, modalities and exercise.        Patient will benefit from skilled therapeutic intervention in order to improve the following deficits and impairments:     Visit Diagnosis: Acute left-sided low back pain, with sciatica presence unspecified  Other symptoms and signs involving the musculoskeletal system  Muscle weakness (generalized)     Problem List Patient Active Problem List   Diagnosis Date Noted  . Spondylolisthesis at L5-S1 level, with left L5 radiculitis 10/28/2017    Brynja Marker Nilda Simmer PT, MPH  01/05/2018, 8:35 AM  [facility] [place], [place], [address] Phone: [phone]   Fax:  [phone]  Name: Manuel Thompson MRN: [id number] Date of Birth: [birth year]

## 2018-01-05 NOTE — Patient Instructions (Signed)
Gastroc Stretch    Stand with right foot back, leg straight, forward leg bent. Keeping heel on floor, turned slightly out, lean into wall until stretch is felt in calf. Hold __30-60__ seconds. Repeat __3__ times per set.  Do __2__ sessions per day.  Achilles / Soleus, Standing    Stand, right foot behind, heel on floor and turned slightly out. Lower hips and bend knees. Hold _30-60__ seconds. Repeat _3__ times per session. Do __2_ sessions per day.

## 2018-01-10 ENCOUNTER — Ambulatory Visit: Payer: BLUE CROSS/BLUE SHIELD | Admitting: Physical Therapy

## 2018-01-10 DIAGNOSIS — R29898 Other symptoms and signs involving the musculoskeletal system: Secondary | ICD-10-CM

## 2018-01-10 DIAGNOSIS — M6281 Muscle weakness (generalized): Secondary | ICD-10-CM | POA: Diagnosis not present

## 2018-01-10 DIAGNOSIS — M545 Low back pain: Secondary | ICD-10-CM | POA: Diagnosis not present

## 2018-01-10 NOTE — Therapy (Signed)
[place], [place], [address] Phone: [phone]   Fax:  [phone]  Physical Therapy Treatment  Patient Details  Name: Manuel Thompson MRN: [id number] Date of Birth: 22-Apr-1986 Referring Provider: Dr. Dianah Field   Encounter Date: 01/10/2018  PT End of Session - 01/10/18 0804    Visit Number  17    Number of Visits  24    Date for PT Re-Evaluation  01/29/18    PT Start Time  0800    PT Stop Time  0904    PT Time Calculation (min)  64 min       No past medical history on file.  No past surgical history on file.  There were no vitals filed for this visit.  Subjective Assessment - 01/10/18 0804    Subjective  Pt reports he did some walking yesterday; regular pace for 50 min (more than usual).  He tried to stretch in night and he reports he "tweaked" his LLE and low back.  This morning he feels stiff.  No pain with coughing or sneezing.  He tried a slight jog to cross busy street and did not have any pain.     Patient Stated Goals  get full ROM back in the Lt LE and get rid of pain     Currently in Pain?  Yes    Pain Score  2     Pain Location  Buttocks    Pain Orientation  Left    Pain Descriptors / Indicators  Tightness    Aggravating Factors   not moving     Pain Relieving Factors  moving around.          [facility] PT Assessment - 01/10/18 0001      Assessment   Medical Diagnosis  lumbar radiculitis; Lt LE pain     Referring Provider  Dr. Dianah Field    Onset Date/Surgical Date  08/05/17    Hand Dominance  Left    Next MD Visit  PRN      Flexibility   Hamstrings  61 deg LLE with supine stretch.          [facility] Adult PT Treatment/Exercise - 01/10/18 0001      Lumbar Exercises: Stretches   Passive Hamstring Stretch  30 seconds;3 reps;Left;Right    Passive Hamstring Stretch Limitations  sitting on raised surface with VC good spinal extension with LE extended .  LE nerve glide  with LLE with pumping foot x 20 sec.      Piriformis Stretch  Right;Left;30 seconds;2 reps    Gastroc Stretch  Right;Left;2 reps;30 seconds    Gastroc Stretch Limitations  soleus stretch Lt/Rt 30 sec x 2       Lumbar Exercises: Aerobic   Elliptical  L2: 5 min  PTA present to discuss progress      Lumbar Exercises: Supine   Bridge  10 reps;5 seconds    Other Supine Lumbar Exercises  LLE nerve glide with hip flex 90 deg and pt flex/extend knee to tolerance x 8      Moist Heat Therapy   Number Minutes Moist Heat  20 Minutes    Moist Heat Location  Lumbar Spine Lt hamstring/calf      Electrical Stimulation   Electrical Stimulation Location  low back and Lt hip     Electrical Stimulation Action  IFC    Electrical Stimulation Parameters  to tolerance     Electrical Stimulation Goals  Tone;Pain  Manual Therapy   Manual therapy comments  pt prone.     Soft tissue mobilization  Edge tool assistance to decrease fascial restrictions to Lt lateral distal hamstring, glute max insertion, Lt lumbar paraspinals; deep tissue / TPR and STM to same areas. STM to Lt glute med.                   PT Long Term Goals - 01/03/18 0809      PT LONG TERM GOAL #1   Title  Improve trunk and LE moblity and ROM to WFL's without pain 01/29/18    Time  12    Period  Weeks    Status  Partially Met      PT LONG TERM GOAL #2   Title  Increase Lt LE strength to 5-/5 to 5/5 without pain with testing 01/29/18    Time  12    Period  Weeks    Status  Partially Met      PT LONG TERM GOAL #3   Title  Decrease pain by 50-75% allowing patient to participate normal functional activities 12/20/17    Time  6    Period  Weeks    Status  Achieved      PT LONG TERM GOAL #4   Title  Independent in HEP 01/29/18    Time  12    Period  Weeks    Status  Partially Met      PT LONG TERM GOAL #5   Title  Improve FOTO to </= 23% limitation 01/29/18    Time  12    Period  Weeks    Status  On-going             Plan - 01/10/18 9969    Clinical Impression Statement  Pt tolerated all exercises without increased symptoms.  Pt's hamstring flexibility similar to last assessment despite flare up of symptoms over last 2 days with increased walking.  Pt reported reduction in pain and tightness in LB and LLE at end of session.  Pt making gradual progress towards remaining goals.     Rehab Potential  Good    PT Frequency  2x / week    PT Duration  6 weeks    PT Treatment/Interventions  Patient/family education;ADLs/Self Care Home Management;Cryotherapy;Electrical Stimulation;Iontophoresis 55m/ml Dexamethasone;Moist Heat;Traction;Ultrasound;Dry needling;Manual techniques;Neuromuscular re-education;Therapeutic activities;Therapeutic exercise    PT Next Visit Plan  continue spinal stabilization and manual therapy. DN as indicated     Consulted and Agree with Plan of Care  Patient       Patient will benefit from skilled therapeutic intervention in order to improve the following deficits and impairments:  Postural dysfunction, Improper body mechanics, Pain, Increased fascial restricitons, Increased muscle spasms, Decreased mobility, Decreased range of motion, Decreased strength, Decreased activity tolerance  Visit Diagnosis: Acute left-sided low back pain, with sciatica presence unspecified  Other symptoms and signs involving the musculoskeletal system  Muscle weakness (generalized)     Problem List Patient Active Problem List   Diagnosis Date Noted  . Spondylolisthesis at L5-S1 level, with left L5 radiculitis 10/28/2017   JKerin Perna PTA 01/10/18 10:14 AM  [place] [address]Phone: [phone]  Fax:  [phone] Name: Manuel FouchMRN: [id number] of Birth: 1June 26, 1987

## 2018-01-12 ENCOUNTER — Ambulatory Visit: Payer: BLUE CROSS/BLUE SHIELD | Admitting: Rehabilitative and Restorative Service Providers"

## 2018-01-12 ENCOUNTER — Encounter: Payer: Self-pay | Admitting: Rehabilitative and Restorative Service Providers"

## 2018-01-12 DIAGNOSIS — R29898 Other symptoms and signs involving the musculoskeletal system: Secondary | ICD-10-CM | POA: Diagnosis not present

## 2018-01-12 DIAGNOSIS — M6281 Muscle weakness (generalized): Secondary | ICD-10-CM | POA: Diagnosis not present

## 2018-01-12 DIAGNOSIS — M545 Low back pain: Secondary | ICD-10-CM | POA: Diagnosis not present

## 2018-01-12 NOTE — Therapy (Signed)
[place], [place], [address] Phone: [phone]   Fax:  [phone]  Physical Therapy Treatment  Patient Details  Name: Manuel Thompson MRN: [id number] Date of Birth: [birth year] Referring Provider: Dr. Dianah Field   Encounter Date: 01/12/2018  PT End of Session - 01/12/18 0848    Visit Number  18    Number of Visits  24    Date for PT Re-Evaluation  01/29/18    PT Start Time  0846    PT Stop Time  0945    PT Time Calculation (min)  59 min    Activity Tolerance  Patient tolerated treatment well       History reviewed. No pertinent past medical history.  History reviewed. No pertinent surgical history.  There were no vitals filed for this visit.  Subjective Assessment - 01/12/18 0849    Subjective  Still some irritation in the Lt hamstreing from Sunday when he "tweaked" the Lt LE and low back. Can feel it in the hip, hamstring and calf. Better following treatment.     Currently in Pain?  Yes when it hurts - no pain with walking     Pain Score  3     Pain Location  Buttocks    Pain Orientation  Left    Pain Descriptors / Indicators  Tightness    Pain Type  Acute pain                       [facility] Adult PT Treatment/Exercise - 01/12/18 0001      Lumbar Exercises: Stretches   Passive Hamstring Stretch  30 seconds;3 reps;Left;Right    Piriformis Stretch  Right;Left;30 seconds;2 reps      Lumbar Exercises: Aerobic   Elliptical  L2: 5 min  PTA present to discuss progress      Moist Heat Therapy   Number Minutes Moist Heat  20 Minutes    Moist Heat Location  Lumbar Spine Lt hamstring/calf      Electrical Stimulation   Electrical Stimulation Location  Lt posterior hip to Lt lateral hamstrings    Electrical Stimulation Action  IFC    Electrical Stimulation Parameters  to tolerance    Electrical Stimulation Goals  Tone;Pain      Manual Therapy   Manual therapy comments   pt prone.     Soft tissue mobilization  deep tissue work through the [employer] QL/lumbar paraspinals; piriformis; hip abductors/gluts; hamstrings; lateral calf        Trigger Point Dry Needling - 01/12/18 0930    Consent Given?  Yes    Muscles Treated Lower Body  -- Lt lumbar to LE with estim     Gluteus Maximus Response  Palpable increased muscle length    Piriformis Response  Palpable increased muscle length    Hamstring Response  Palpable increased muscle length    Gastrocnemius Response  Palpable increased muscle length    Soleus Response  Palpable increased muscle length                PT Long Term Goals - 01/03/18 0809      PT LONG TERM GOAL #1   Title  Improve trunk and LE moblity and ROM to WFL's without pain 01/29/18    Time  12    Period  Weeks    Status  Partially Met      PT LONG TERM GOAL #2   Title  Increase Lt  LE strength to 5-/5 to 5/5 without pain with testing 01/29/18    Time  12    Period  Weeks    Status  Partially Met      PT LONG TERM GOAL #3   Title  Decrease pain by 50-75% allowing patient to participate normal functional activities 12/20/17    Time  6    Period  Weeks    Status  Achieved      PT LONG TERM GOAL #4   Title  Independent in HEP 01/29/18    Time  12    Period  Weeks    Status  Partially Met      PT LONG TERM GOAL #5   Title  Improve FOTO to </= 23% limitation 01/29/18    Time  12    Period  Weeks    Status  On-going            Plan - 01/12/18 0931    Clinical Impression Statement  Increased tissue and neural tightness Lt LE from near fall Sunday. Patient responds well to DN, manual work, stretching, and modalities. Decreased pain and improved tissue extensibility following treatment.     Rehab Potential  Good    PT Frequency  2x / week    PT Duration  6 weeks    PT Treatment/Interventions  Patient/family education;ADLs/Self Care Home Management;Cryotherapy;Electrical Stimulation;Iontophoresis 4mg/ml Dexamethasone;Moist  Heat;Traction;Ultrasound;Dry needling;Manual techniques;Neuromuscular re-education;Therapeutic activities;Therapeutic exercise    PT Next Visit Plan  continue spinal stabilization and manual therapy. DN as indicated     Consulted and Agree with Plan of Care  Patient       Patient will benefit from skilled therapeutic intervention in order to improve the following deficits and impairments:  Postural dysfunction, Improper body mechanics, Pain, Increased fascial restricitons, Increased muscle spasms, Decreased mobility, Decreased range of motion, Decreased strength, Decreased activity tolerance  Visit Diagnosis: Acute left-sided low back pain, with sciatica presence unspecified  Other symptoms and signs involving the musculoskeletal system  Muscle weakness (generalized)     Problem List Patient Active Problem List   Diagnosis Date Noted  . Spondylolisthesis at L5-S1 level, with left L5 radiculitis 10/28/2017    Celyn P Holt PT, MPH  01/12/2018, 9:34 AM  [facility] [address] [place], , [address] Phone: [phone]   Fax:  [phone]  Name: Maksym Mccallum MRN: [id number] Date of Birth: 01/16/1986   

## 2018-01-18 ENCOUNTER — Ambulatory Visit (INDEPENDENT_AMBULATORY_CARE_PROVIDER_SITE_OTHER): Payer: BLUE CROSS/BLUE SHIELD | Admitting: Physical Therapy

## 2018-01-18 ENCOUNTER — Encounter: Payer: Self-pay | Admitting: Physical Therapy

## 2018-01-18 DIAGNOSIS — M4317 Spondylolisthesis, lumbosacral region: Secondary | ICD-10-CM | POA: Diagnosis not present

## 2018-01-18 DIAGNOSIS — M545 Low back pain: Secondary | ICD-10-CM | POA: Diagnosis not present

## 2018-01-18 DIAGNOSIS — R29898 Other symptoms and signs involving the musculoskeletal system: Secondary | ICD-10-CM

## 2018-01-18 DIAGNOSIS — M6281 Muscle weakness (generalized): Secondary | ICD-10-CM | POA: Diagnosis not present

## 2018-01-18 NOTE — Therapy (Signed)
[place], [place], [address] Phone: [phone]   Fax:  [phone]  Physical Therapy Treatment  Patient Details  Name: Manuel Thompson MRN: [id number] Date of Birth: Mar 16, 1986 Referring Provider: Dr. Dianah Field   Encounter Date: 01/18/2018  PT End of Session - 01/18/18 1111    Visit Number  19    Number of Visits  24    Date for PT Re-Evaluation  01/29/18    PT Start Time  1100    PT Stop Time  1200    PT Time Calculation (min)  60 min    Activity Tolerance  Patient tolerated treatment well    Behavior During Therapy  [facility] for tasks assessed/performed       History reviewed. No pertinent past medical history.  History reviewed. No pertinent surgical history.  There were no vitals filed for this visit.  Subjective Assessment - 01/18/18 1112    Subjective  Had some stiffness in L hamstrings, proximal L calf in the morning; alleviated by using foam roller. He's still recovering from his most recent flare up.     Currently in Pain?  No/denies Stiffness in low back, hamstrings  Later in session, reported 2/10 in Lt glute upon arrival and resolved upon leaving.         [facility] PT Assessment - 01/18/18 0001      Assessment   Medical Diagnosis  lumbar radiculitis; Lt LE pain     Referring Provider  Dr. Dianah Field    Onset Date/Surgical Date  08/05/17    Hand Dominance  Left    Next MD Visit  PRN      Strength   Left Hip Extension  5/5      Flexibility   Hamstrings  60 deg LLE with supine stretch.      FOTO:  28% limited, goal of 23%.   [facility] Adult PT Treatment/Exercise - 01/18/18 0001      Lumbar Exercises: Stretches   Passive Hamstring Stretch  30 seconds;3 reps;Left;Right    Sports administrator  3 reps;30 seconds;Right;Left    ITB Stretch  Left;2 rep;30 seconds    Piriformis Stretch  Right;Left;30 seconds;2 reps, 2 sets on the Lt    Gastroc Stretch  Right;Left;2 reps;30 seconds    Gastroc Stretch Limitations  soleus stretch Lt/Rt 30 sec x 2       Lumbar Exercises: Aerobic   Elliptical  L2: 5 min  PTA present to discuss progress      Lumbar Exercises: Supine   Bridge  10 reps;5 seconds 2 sets; alternating L and R LE raised off table    Other Supine Lumbar Exercises  LLE nerve glide with hip flex 90 deg and pt flex/extend knee to tolerance x 8      Lumbar Exercises: Prone   Other Prone Lumbar Exercises  prone press-up x3      Moist Heat Therapy   Number Minutes Moist Heat  15 Minutes    Moist Heat Location  Lumbar Spine Lt hamstring/calf      Electrical Stimulation   Electrical Stimulation Location  L lateral hamstrings, L lateral calf    Electrical Stimulation Action Combo [place] and Premod    Electrical Stimulation Parameters  to tolerance    Electrical Stimulation Goals  Tone;Pain      Ultrasound   Ultrasound Location  L gluteus maximus, piriformis    Ultrasound Parameters  combo [place]: 100% duty cycle 1 MHz @ 1.3 W/cm2, 8  min.    Ultrasound Goals  Pain tone      Manual Therapy   Manual therapy comments  pt prone.     Soft tissue mobilization  deep tissue work through the  piriformis; hip abductors/gluts; hamstrings        PT Long Term Goals - 01/18/18 1313      PT LONG TERM GOAL #1   Title  Improve trunk and LE moblity and ROM to WFL's without pain 01/29/18    Time  12    Period  Weeks    Status  Partially Met      PT LONG TERM GOAL #2   Title  Increase Lt LE strength to 5-/5 to 5/5 without pain with testing 01/29/18    Time  12    Period  Weeks    Status  Achieved      PT LONG TERM GOAL #3   Title  Decrease pain by 50-75% allowing patient to participate normal functional activities 12/20/17    Time  6    Period  Weeks    Status  Achieved      PT LONG TERM GOAL #4   Title  Independent in HEP 01/29/18    Time  12    Period  Weeks    Status  Partially Met      PT LONG TERM GOAL #5   Title  Improve FOTO to </= 23% limitation 01/29/18    Time   12    Period  Weeks         Plan - 01/18/18 1224    Clinical Impression Statement  Pt continues to have hamstring and neural tightness, responding well to manual work, stretching, and modalities. Has met LTG#2 (LLE strength 5/5 without pain). Tissue extensibility was increased following treatment.    Rehab Potential  Good    PT Frequency  2x / week    PT Duration  6 weeks    PT Treatment/Interventions  Patient/family education;ADLs/Self Care Home Management;Cryotherapy;Electrical Stimulation;Iontophoresis '4mg'$ /ml Dexamethasone;Moist Heat;Traction;Ultrasound;Dry needling;Manual techniques;Neuromuscular re-education;Therapeutic activities;Therapeutic exercise    PT Next Visit Plan  continue spinal stabilization and manual therapy. DN and modalities as indicated. Discuss possible change in freq due to limited remaining visits.    Consulted and Agree with Plan of Care  Patient       Patient will benefit from skilled therapeutic intervention in order to improve the following deficits and impairments:  Postural dysfunction, Improper body mechanics, Pain, Increased fascial restricitons, Increased muscle spasms, Decreased mobility, Decreased range of motion, Decreased strength, Decreased activity tolerance  Visit Diagnosis: Acute left-sided low back pain, with sciatica presence unspecified  Spondylolisthesis at L5-S1 level  Other symptoms and signs involving the musculoskeletal system  Muscle weakness (generalized)     Problem List Patient Active Problem List   Diagnosis Date Noted  . Spondylolisthesis at L5-S1 level, with left L5 radiculitis 10/28/2017    Scarlett Presto, SPTA 01/18/18 1:05 PM   This entire session was performed under direct supervision and direction of a licensed physical therapist assistant. I have personally read, edited and approve of the note as written.  Kerin Perna, PTA 01/18/18 1:19 PM  [facility] [place], [place], [address] Phone: [phone]   Fax:  [phone]  Name: Romond Pipkins MRN: [id number] Date of Birth: 11-10-1985

## 2018-01-20 ENCOUNTER — Encounter: Payer: Self-pay | Admitting: Rehabilitative and Restorative Service Providers"

## 2018-01-20 ENCOUNTER — Ambulatory Visit (INDEPENDENT_AMBULATORY_CARE_PROVIDER_SITE_OTHER): Payer: BLUE CROSS/BLUE SHIELD | Admitting: Rehabilitative and Restorative Service Providers"

## 2018-01-20 DIAGNOSIS — M6281 Muscle weakness (generalized): Secondary | ICD-10-CM | POA: Diagnosis not present

## 2018-01-20 DIAGNOSIS — M4317 Spondylolisthesis, lumbosacral region: Secondary | ICD-10-CM

## 2018-01-20 DIAGNOSIS — R29898 Other symptoms and signs involving the musculoskeletal system: Secondary | ICD-10-CM

## 2018-01-20 DIAGNOSIS — M545 Low back pain: Secondary | ICD-10-CM

## 2018-01-20 NOTE — Therapy (Addendum)
[place], [place], [address] Phone: [phone]   Fax:  [phone]  Physical Therapy Treatment  Patient Details  Name: Manuel Thompson MRN: [id number] Date of Birth: 29-May-1986 Referring Provider: Dr. Dianah Field   Encounter Date: 01/20/2018  PT End of Session - 01/20/18 0814    Visit Number  20    Number of Visits  24    Date for PT Re-Evaluation  01/29/18    PT Start Time  0802    PT Stop Time  0900    PT Time Calculation (min)  58 min    Activity Tolerance  Patient tolerated treatment well       History reviewed. No pertinent past medical history.  History reviewed. No pertinent surgical history.  There were no vitals filed for this visit.  Subjective Assessment - 01/20/18 0816    Subjective  Imporoving. Continues to have some increased tightness - mostly lateral and posterior knee. No pain with sneezing or coughing. Functional activities are pain free. Tightness improves with stretching.     Currently in Pain?  No/denies                       [facility] Adult PT Treatment/Exercise - 01/20/18 0001      Neuro Re-ed    Neuro Re-ed Details   education re neutral spine posture       Lumbar Exercises: Stretches   Passive Hamstring Stretch  30 seconds;3 reps;Left;Right    Sports administrator  3 reps;30 seconds;Right;Left    ITB Stretch  Left;20 seconds;3 reps    Piriformis Stretch  Right;Left;30 seconds;2 reps    Gastroc Stretch  Right;Left;2 reps;30 seconds    Gastroc Stretch Limitations  soleus stretch Lt/Rt 30 sec x 2       Lumbar Exercises: Aerobic   Elliptical  L2: 5 min  PTA present to discuss progress      Lumbar Exercises: Supine   Other Supine Lumbar Exercises  LLE nerve glide with hip flex 90 deg and pt flex/extend knee to tolerance x 8      Moist Heat Therapy   Number Minutes Moist Heat  20 Minutes    Moist Heat Location  Lumbar Spine Lt hamstring/calf       Electrical Stimulation   Electrical Stimulation Location  L lateral hamstrings, L lateral calf    Electrical Stimulation Action  IFC    Electrical Stimulation Parameters  to tolerance    Electrical Stimulation Goals  Tone;Pain      Manual Therapy   Manual therapy comments  pt prone.     Joint Mobilization  CPA and lateral mobs through the lumbar spine     Soft tissue mobilization  deep tissue work through the [employer] QL/lumbar paraspinals; piriformis; hip abductors/gluts; hamstrings; lateral calf     Myofascial Release  Lt lumbar     Kinesiotex  -- taping into neutral lumbar spine position   Dynamic tape applied to bilat QL with 10% while pt was in POE position, to increase proprioception and postural awareness.       Trigger Point Dry Needling - 01/20/18 0829    Consent Given?  Yes    Muscles Treated Lower Body  -- Lt lumbar/LE with estim     Tensor Fascia Lata Response  Palpable increased muscle length    Hamstring Response  Palpable increased muscle length    Gastrocnemius Response  Palpable increased muscle length  Soleus Response  Palpable increased muscle length           PT Education - 01/20/18 1145    Education provided  Yes    Education Details  taping     Person(s) Educated  Patient    Methods  Explanation    Comprehension  Verbalized understanding          PT Long Term Goals - 01/18/18 1313      PT LONG TERM GOAL #1   Title  Improve trunk and LE moblity and ROM to WFL's without pain 01/29/18    Time  12    Period  Weeks    Status  Partially Met      PT LONG TERM GOAL #2   Title  Increase Lt LE strength to 5-/5 to 5/5 without pain with testing 01/29/18    Time  12    Period  Weeks    Status  Achieved      PT LONG TERM GOAL #3   Title  Decrease pain by 50-75% allowing patient to participate normal functional activities 12/20/17    Time  6    Period  Weeks    Status  Achieved      PT LONG TERM GOAL #4   Title  Independent in HEP 01/29/18    Time  12     Period  Weeks    Status  Partially Met      PT LONG TERM GOAL #5   Title  Improve FOTO to </= 23% limitation 01/29/18    Time  12    Period  Weeks            Plan - 01/20/18 1607    Clinical Impression Statement  Tightness lateral thigh into lateral knee - ITB tightness. Added supine stretch for ITB. Good response to DN and manual work. Noted patient stands with slight flexed forward posture - which is likely increased with work tasks bending forward slightly thoughout his day. Added trial of lumbar tape in neutral spine position for postural education in upright posture with neutral spine position. This may further decrease symptoms. Long hours of standing with posterior pelvic tilt position may contribute to continued neural tension and nerve irritation. Patient continues to progress toward goals.     Rehab Potential  Good    PT Frequency  2x / week    PT Duration  6 weeks    PT Treatment/Interventions  Patient/family education;ADLs/Self Care Home Management;Cryotherapy;Electrical Stimulation;Iontophoresis '4mg'$ /ml Dexamethasone;Moist Heat;Traction;Ultrasound;Dry needling;Manual techniques;Neuromuscular re-education;Therapeutic activities;Therapeutic exercise    PT Next Visit Plan  continue spinal stabilization and manual therapy. DN and modalities as indicated; assess response to taping for neutral spine posture     Consulted and Agree with Plan of Care  Patient       Patient will benefit from skilled therapeutic intervention in order to improve the following deficits and impairments:  Postural dysfunction, Improper body mechanics, Pain, Increased fascial restricitons, Increased muscle spasms, Decreased mobility, Decreased range of motion, Decreased strength, Decreased activity tolerance  Visit Diagnosis: Spondylolisthesis at L5-S1 level  Acute left-sided low back pain, with sciatica presence unspecified  Other symptoms and signs involving the musculoskeletal system  Muscle  weakness (generalized)     Problem List Patient Active Problem List   Diagnosis Date Noted  . Spondylolisthesis at L5-S1 level, with left L5 radiculitis 10/28/2017    Lakya Schrupp Nilda Simmer PT, MPH  01/20/2018, 11:46 AM  Kerin Perna, PTA 01/20/18 12:41 PM  [facility] [place], [place], [address] Phone: [phone]   Fax:  [phone]  Name: Javar Eshbach MRN: [id number] Date of Birth: 1986-04-03  PHYSICAL THERAPY DISCHARGE SUMMARY  Visits from Start of Care: 20  Current functional level related to goals / functional outcomes: See progress note for discharge status    Remaining deficits: Unknown    Education / Equipment: HEP  Plan: Patient agrees to discharge.  Patient goals were partially met. Patient is being discharged due to being pleased with the current functional level.  ?????    Amandajo Gonder P. Helene Kelp PT, MPH 02/10/18 2:36 PM

## 2018-01-20 NOTE — Patient Instructions (Signed)

## 2018-12-14 ENCOUNTER — Emergency Department
Admission: EM | Admit: 2018-12-14 | Discharge: 2018-12-14 | Disposition: A | Discharge status: Home / Self Care | Payer: BLUE CROSS/BLUE SHIELD | Source: Home / Self Care

## 2018-12-14 ENCOUNTER — Encounter: Payer: Self-pay | Admitting: Emergency Medicine

## 2018-12-14 ENCOUNTER — Other Ambulatory Visit: Payer: Self-pay

## 2018-12-14 DIAGNOSIS — J4521 Mild intermittent asthma with (acute) exacerbation: Secondary | ICD-10-CM

## 2018-12-14 HISTORY — DX: Unspecified asthma, uncomplicated: J45.909

## 2018-12-14 MED ORDER — CETIRIZINE HCL 10 MG PO TABS: 10.0000 mg | ORAL_TABLET | Freq: Every day | ORAL | 0 refills | Status: AC

## 2018-12-14 MED ORDER — ALBUTEROL SULFATE HFA 108 (90 BASE) MCG/ACT IN AERS: 1.0000 | INHALATION_SPRAY | Freq: Four times a day (QID) | RESPIRATORY_TRACT | 0 refills | Status: AC | PRN

## 2018-12-14 MED ORDER — FLUTICASONE PROPIONATE 50 MCG/ACT NA SUSP: 2.0000 | Freq: Every day | NASAL | 2 refills | Status: AC

## 2018-12-14 NOTE — ED Triage Notes (Signed)
Patient has had intermittent SOB for 7 months, worse today. Has asthma but has not needed inhaler for over a year.

## 2018-12-14 NOTE — ED Provider Notes (Signed)
[facility]    CSN: [id number] Arrival date & time: 12/14/18  1247     History   Chief Complaint Chief Complaint  Patient presents with  . Shortness of Breath    HPI Manuel Thompson is a 33 y.o. male.   HPI  Dhaval Woo is a 33 y.o. male presenting to UC with c/o intermittent SOB for about 7 months. He was dx with asthma when he was 33yo but has not needed an inhaler since high school.  Recently he has had episodes of SOB and dry cough. He does not take any allergy medication and does not have an inhaler. Denies fever, chills, cough congestion or SOB at this time. Denies chest pain. He does not smoke or vape and is not around others who smoke or vape. No recent pets or new living environment. No known trigger.    Past Medical History:  Diagnosis Date  . Asthma     Patient Active Problem List   Diagnosis Date Noted  . Spondylolisthesis at L5-S1 level, with left L5 radiculitis 10/28/2017    History reviewed. No pertinent surgical history.     Home Medications    Prior to Admission medications   Medication Sig Start Date End Date Taking? Authorizing Provider  albuterol (PROVENTIL HFA;VENTOLIN HFA) 108 (90 Base) MCG/ACT inhaler Inhale 1-2 puffs into the lungs every 6 (six) hours as needed for wheezing or shortness of breath. 12/14/18   Lurene Shadow, PA-C  cetirizine (ZYRTEC) 10 MG tablet Take 1 tablet (10 mg total) by mouth daily. 12/14/18   Lurene Shadow, PA-C  fluticasone (FLONASE) 50 MCG/ACT nasal spray Place 2 sprays into both nostrils daily. 12/14/18   Lurene Shadow, PA-C    Family History Family History  Problem Relation Age of Onset  . Hypertension Mother     Social History Social History   Tobacco Use  . Smoking status: Never Smoker  . Smokeless tobacco: Never Used  Substance Use Topics  . Alcohol use: Yes    Comment: 1-2 q wk  . Drug use: No     Allergies   Amoxicillin   Review of Systems Review of Systems   Constitutional: Negative for chills and fever.  HENT: Positive for congestion ( minimal). Negative for ear pain, sore throat, trouble swallowing and voice change.   Respiratory: Positive for cough, chest tightness and shortness of breath.   Cardiovascular: Negative for chest pain and palpitations.  Gastrointestinal: Negative for abdominal pain, diarrhea, nausea and vomiting.  Musculoskeletal: Negative for arthralgias, back pain and myalgias.  Skin: Negative for rash.     Physical Exam Triage Vital Signs ED Triage Vitals  Enc Vitals Group     BP 12/14/18 1321 127/89     Pulse Rate 12/14/18 1321 76     Resp --      Temp 12/14/18 1321 98.6 F (37 C)     Temp Source 12/14/18 1321 Oral     SpO2 12/14/18 1321 99 %     Weight 12/14/18 1322 195 lb (88.5 kg)     Height 12/14/18 1322 6' 0.5" (1.842 m)     Head Circumference --      Peak Flow --      Pain Score 12/14/18 1322 0     Pain Loc --      Pain Edu? --      Excl. in GC? --    No data found.  Updated Vital Signs BP 127/89 (BP Location: Right Arm)  Pulse 76   Temp 98.6 F (37 C) (Oral)   Ht 6' 0.5" (1.842 m)   Wt 195 lb (88.5 kg)   SpO2 99%   BMI 26.08 kg/m   Visual Acuity Right Eye Distance:   Left Eye Distance:   Bilateral Distance:    Right Eye Near:   Left Eye Near:    Bilateral Near:     Physical Exam Vitals signs and nursing note reviewed.  Constitutional:      Appearance: He is well-developed.  HENT:     Head: Normocephalic and atraumatic.     Right Ear: Tympanic membrane normal.     Left Ear: Tympanic membrane normal.     Nose: Nose normal.     Mouth/Throat:     Lips: Pink.     Mouth: Mucous membranes are moist.     Pharynx: Oropharynx is clear. Uvula midline.  Neck:     Musculoskeletal: Normal range of motion.  Cardiovascular:     Rate and Rhythm: Normal rate and regular rhythm.  Pulmonary:     Effort: Pulmonary effort is normal.     Breath sounds: Normal breath sounds. No decreased breath  sounds, wheezing, rhonchi or rales.  Musculoskeletal: Normal range of motion.  Skin:    General: Skin is warm and dry.  Neurological:     Mental Status: He is alert and oriented to person, place, and time.  Psychiatric:        Behavior: Behavior normal.      UC Treatments / Results  Labs (all labs ordered are listed, but only abnormal results are displayed) Labs Reviewed - No data to display  EKG None  Radiology No results found.  Procedures Procedures (including critical care time)  Medications Ordered in UC Medications - No data to display  Initial Impression / Assessment and Plan / UC Course  I have reviewed the triage vital signs and the nursing notes.  Pertinent labs & imaging results that were available during my care of the patient were reviewed by me and considered in my medical decision making (see chart for details).     Lungs sound clear on exam, O2 Sat 99% on RA No respiratory distress Prescription provided for cetirizine, albuterol inhaler and flonase based on pt's hx Encouraged f/u with PCP Discussed symptoms that warrant emergent care in the ED.  Final Clinical Impressions(s) / UC Diagnoses   Final diagnoses:  Mild intermittent asthma with exacerbation     Discharge Instructions      You should try using the cetirizine (Zyrtec) and flonase daily to help prevent asthma flare ups. You may use the inhaler every 4-6 hours as needed for chest tightness/wheeze. If you continue to need your inhaler weekly after trying the allergy medication for 1-2 weeks, it is recommended you be reevaluated, preferably by your primary care provider in case you need a referral to an allergist.  Call 911 or go to the hospital if you develop worsening shortness of breath not relieved by your inhaler.     ED Prescriptions    Medication Sig Dispense Auth. Provider   cetirizine (ZYRTEC) 10 MG tablet Take 1 tablet (10 mg total) by mouth daily. 30 tablet Nellie Pester O,  PA-C   albuterol (PROVENTIL HFA;VENTOLIN HFA) 108 (90 Base) MCG/ACT inhaler Inhale 1-2 puffs into the lungs every 6 (six) hours as needed for wheezing or shortness of breath. 1 Inhaler Kelin Nixon O, PA-C   fluticasone (FLONASE) 50 MCG/ACT nasal spray Place 2 sprays  into both nostrils daily. 16 g Lurene Shadow, PA-C     Controlled Substance Prescriptions [place] Controlled Substance Registry consulted? Not Applicable   Rolla Plate 12/14/18 1715

## 2018-12-14 NOTE — Discharge Instructions (Signed)
°  You should try using the cetirizine (Zyrtec) and flonase daily to help prevent asthma flare ups. You may use the inhaler every 4-6 hours as needed for chest tightness/wheeze. If you continue to need your inhaler weekly after trying the allergy medication for 1-2 weeks, it is recommended you be reevaluated, preferably by your primary care provider in case you need a referral to an allergist.  Call 911 or go to the hospital if you develop worsening shortness of breath not relieved by your inhaler.

## 2019-05-15 IMAGING — DX DG LUMBAR SPINE COMPLETE 4+V
5 series · 5 of 5 positions shown · non-contrast
Comparison: None

CLINICAL DATA: LEFT lumbar radiculitis for 3 months, no known
injury

EXAM:
LUMBAR SPINE - COMPLETE 4+ VIEW

[l-spine ap]
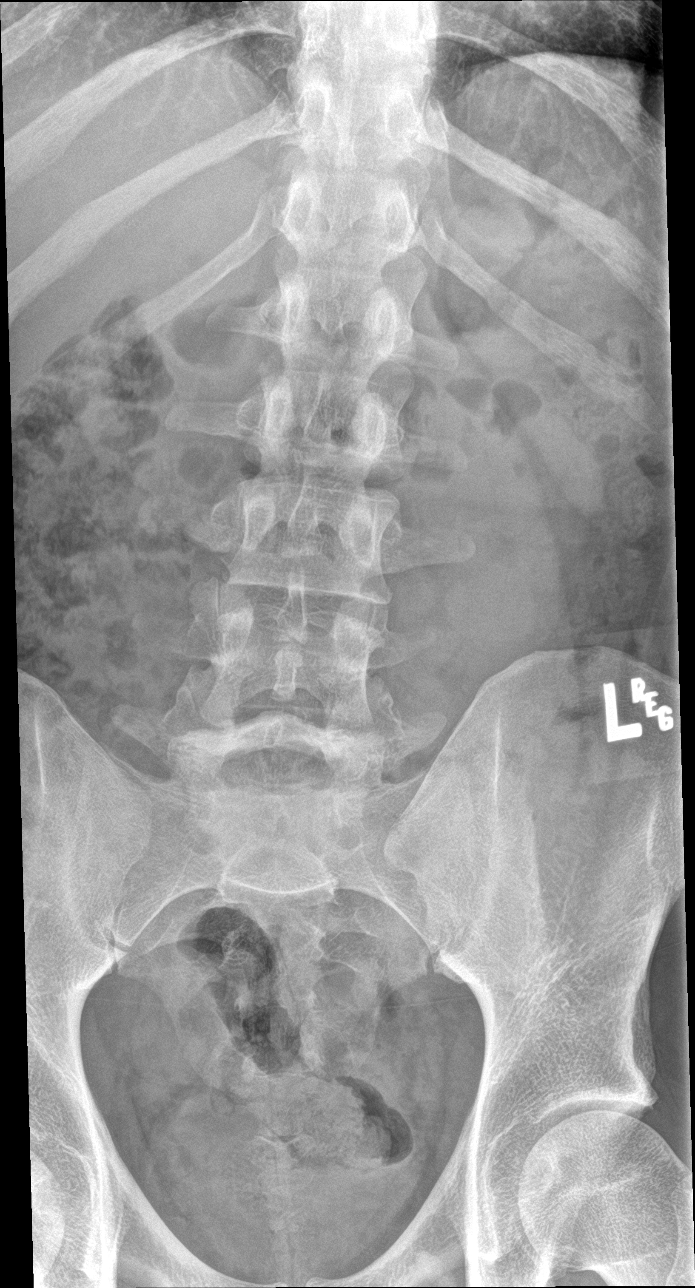

[l-spine obl (1 of 2)]
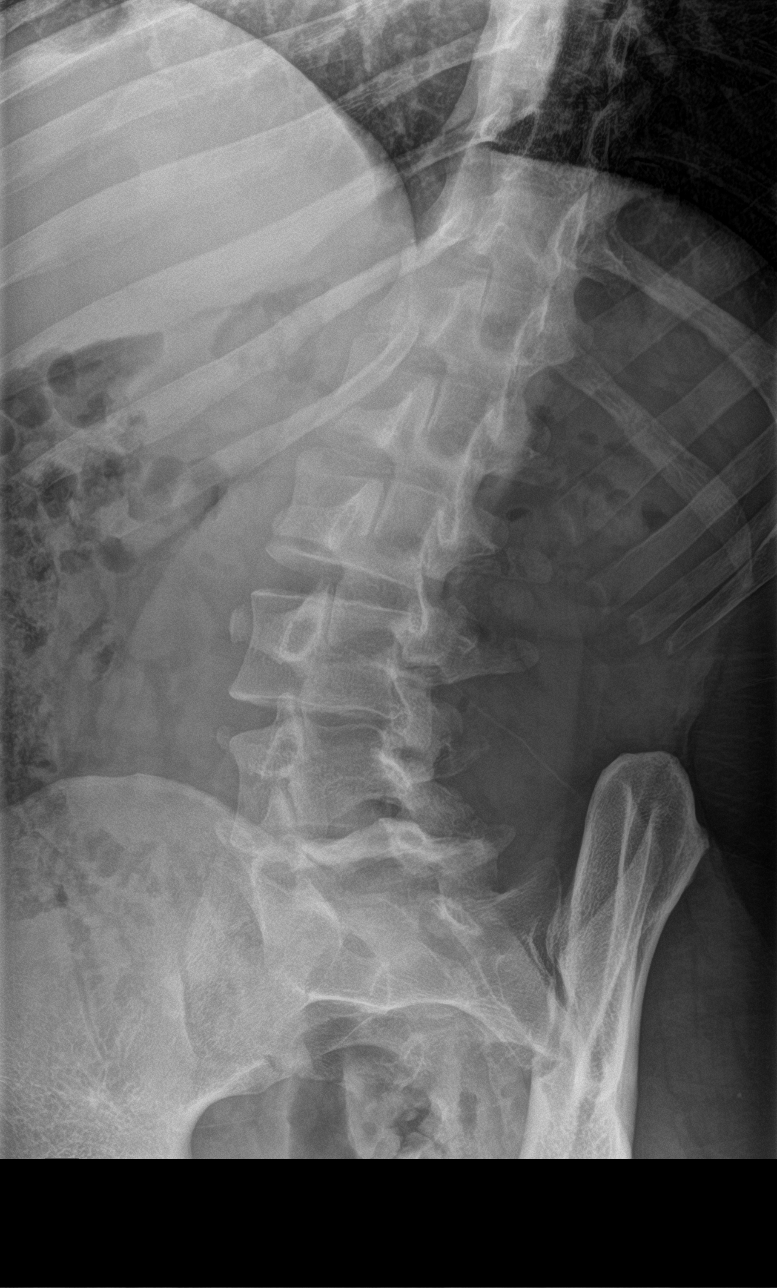

[l-spine obl (2 of 2)]
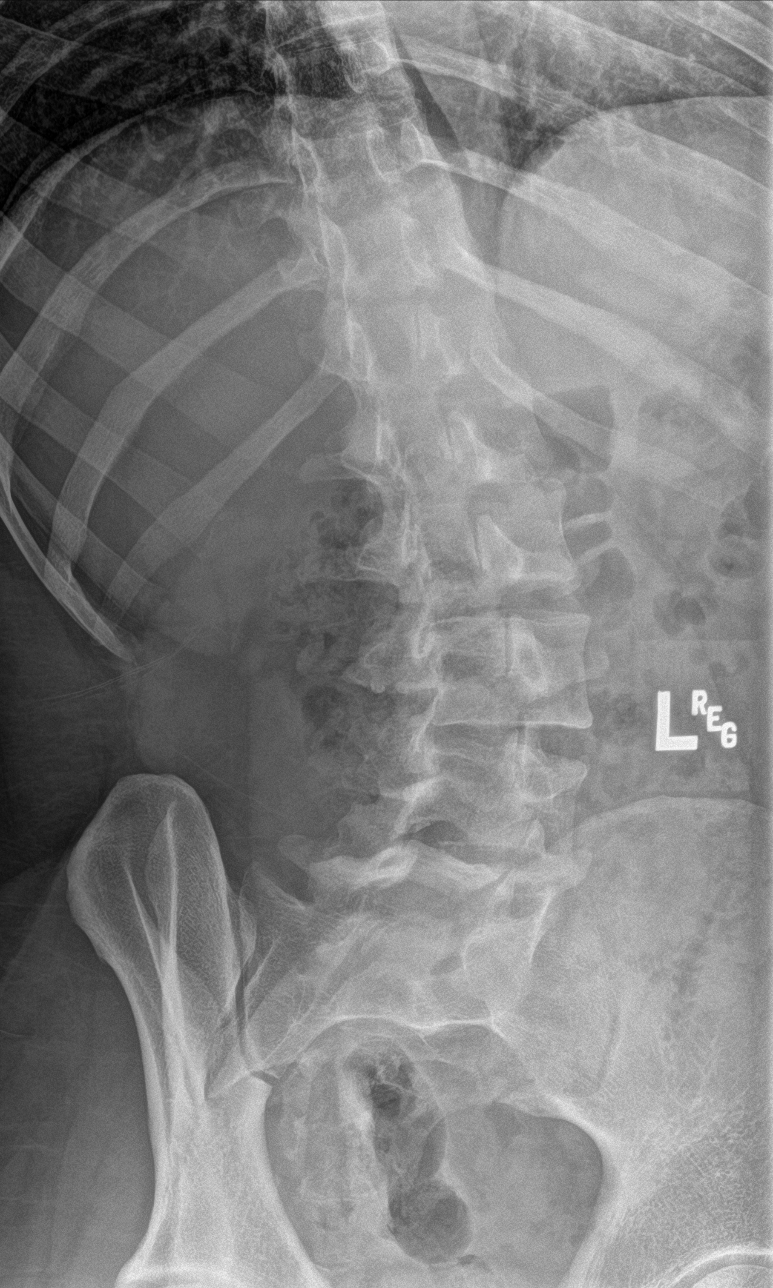

[l-spine lat]
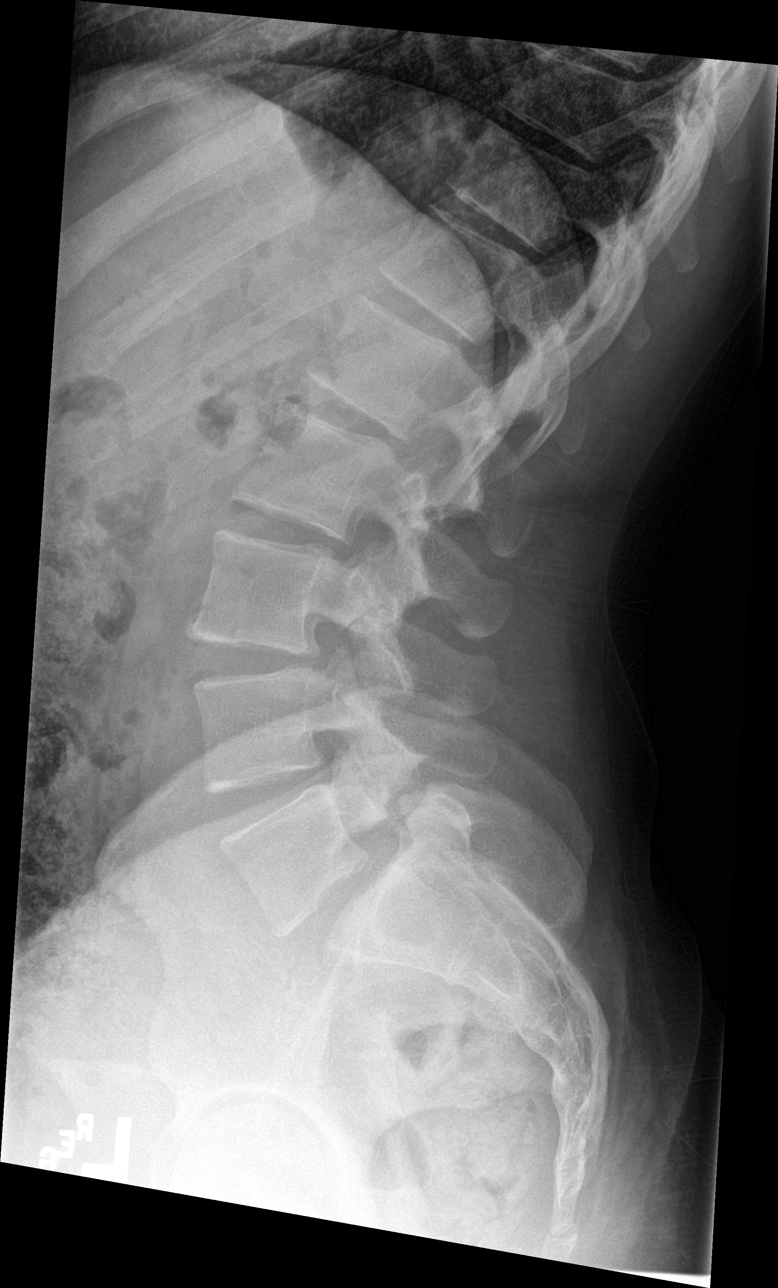

[l-spine spot]
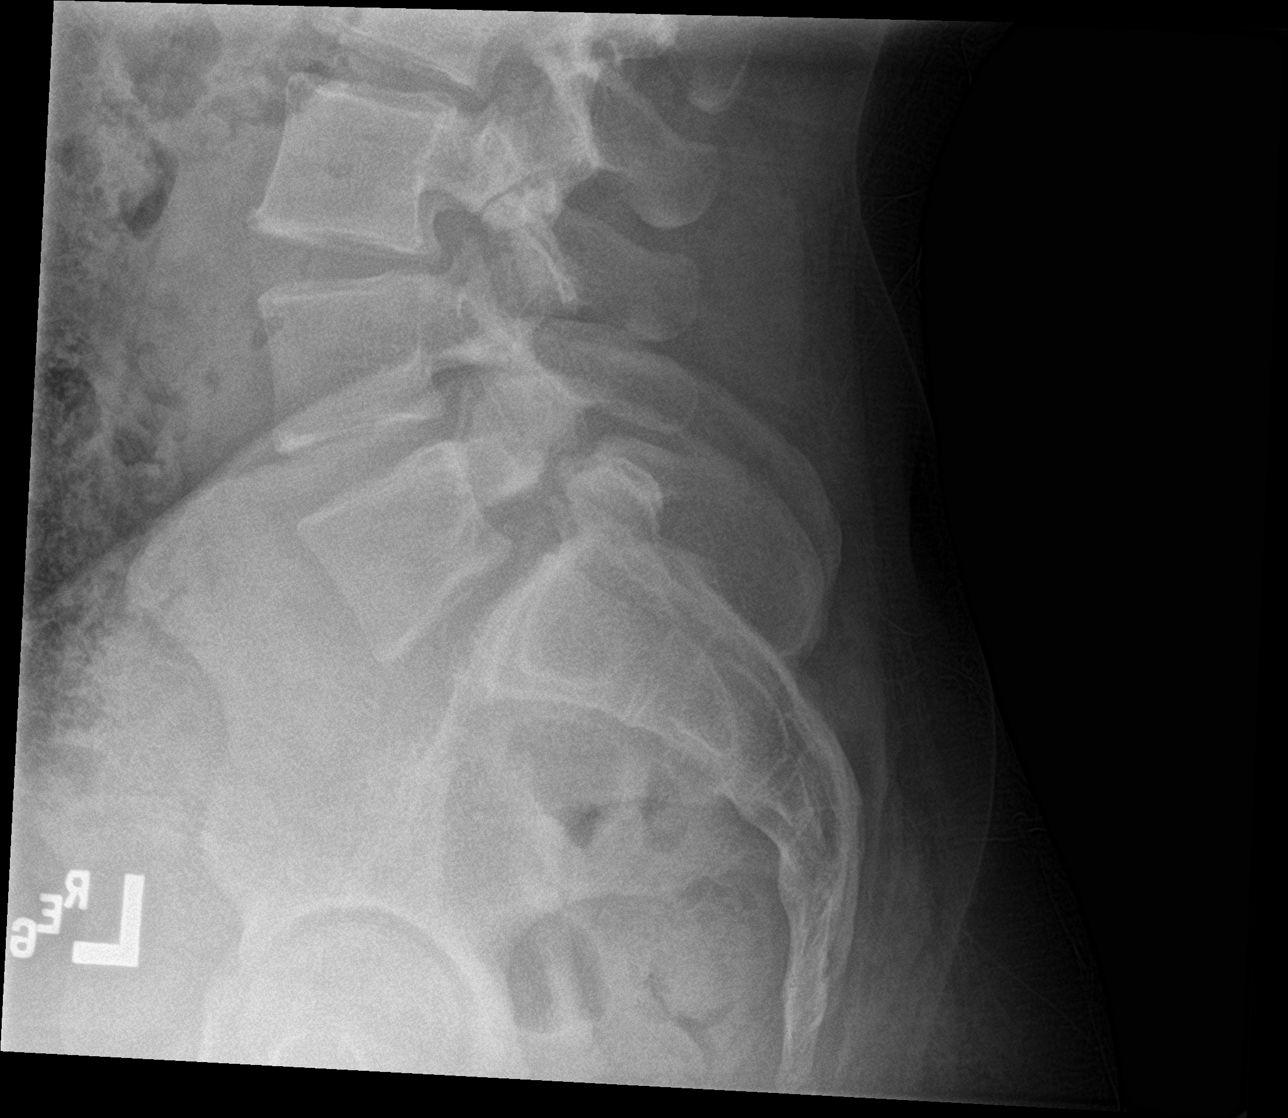

[5 of 5 positions shown; findings below may reference images not displayed]

FINDINGS: Five non-rib-bearing lumbar vertebra.

Vertebral body and disc space heights maintained.

BILATERAL spondylolysis L5 with grade 1 spondylolisthesis.

No acute fracture, additional subluxation or bone destruction.

Osseous mineralization normal.

SI joints preserved.
IMPRESSION: Grade 1 spondylolisthesis at L5-S1 secondary to BILATERAL
spondylolysis L5.

Remainder of exam unremarkable.

## 2019-09-13 ENCOUNTER — Other Ambulatory Visit: Payer: Self-pay

## 2019-09-13 ENCOUNTER — Emergency Department
Admission: EM | Admit: 2019-09-13 | Discharge: 2019-09-13 | Disposition: A | Discharge status: Home / Self Care | Payer: BLUE CROSS/BLUE SHIELD | Source: Home / Self Care | Attending: Family Medicine | Admitting: Family Medicine

## 2019-09-13 DIAGNOSIS — Z113 Encounter for screening for infections with a predominantly sexual mode of transmission: Secondary | ICD-10-CM | POA: Diagnosis not present

## 2019-09-13 NOTE — ED Triage Notes (Signed)
Pt here today for STD testing. Says he currently doesn't have any sxs.

## 2019-09-13 NOTE — ED Provider Notes (Signed)
[facility]    CSN: [id number] Arrival date & time: 09/13/19  1113      History   Chief Complaint Chief Complaint  Patient presents with  . STD testing    HPI Arsal Tappan is a 33 y.o. male.   Patient presents for STD testing.  He is completely assymptomatic at present.  The history is provided by the patient.    Past Medical History:  Diagnosis Date  . Asthma     Patient Active Problem List   Diagnosis Date Noted  . Spondylolisthesis at L5-S1 level, with left L5 radiculitis 10/28/2017    History reviewed. No pertinent surgical history.     Home Medications    Prior to Admission medications   Medication Sig Start Date End Date Taking? Authorizing Provider  albuterol (PROVENTIL HFA;VENTOLIN HFA) 108 (90 Base) MCG/ACT inhaler Inhale 1-2 puffs into the lungs every 6 (six) hours as needed for wheezing or shortness of breath. 12/14/18   Noe Gens, PA-C  cetirizine (ZYRTEC) 10 MG tablet Take 1 tablet (10 mg total) by mouth daily. 12/14/18   Noe Gens, PA-C  fluticasone (FLONASE) 50 MCG/ACT nasal spray Place 2 sprays into both nostrils daily. 12/14/18   Noe Gens, PA-C    Family History Family History  Problem Relation Age of Onset  . Hypertension Mother     Social History Social History   Tobacco Use  . Smoking status: Never Smoker  . Smokeless tobacco: Never Used  Substance Use Topics  . Alcohol use: Yes    Comment: 1-2 q wk  . Drug use: No     Allergies   Amoxicillin   Review of Systems Review of Systems No sore throat No cough No pleuritic pain No wheezing No nasal congestion No post-nasal drainage No sinus pain/pressure No itchy/red eyes No earache No hemoptysis No SOB No fever/chills No nausea No vomiting No abdominal pain No diarrhea No urinary symptoms No skin rash No fatigue No myalgias No headache   Physical Exam Triage Vital Signs ED Triage Vitals  Enc Vitals Group     BP 09/13/19 1139  (!) 139/93     Pulse Rate 09/13/19 1139 94     Resp 09/13/19 1139 18     Temp 09/13/19 1139 98 F (36.7 C)     Temp Source 09/13/19 1139 Oral     SpO2 09/13/19 1139 98 %     Weight 09/13/19 1140 208 lb (94.3 kg)     Height 09/13/19 1140 6' (1.829 m)     Head Circumference --      Peak Flow --      Pain Score 09/13/19 1140 0     Pain Loc --      Pain Edu? --      Excl. in [place]? --    No data found.  Updated Vital Signs BP (!) 139/93 (BP Location: Right Arm)   Pulse 94   Temp 98 F (36.7 C) (Oral)   Resp 18   Ht 6' (1.829 m)   Wt 94.3 kg   SpO2 98%   BMI 28.21 kg/m   Visual Acuity Right Eye Distance:   Left Eye Distance:   Bilateral Distance:    Right Eye Near:   Left Eye Near:    Bilateral Near:     Physical Exam Vitals signs and nursing note reviewed.  Constitutional:      General: He is not in acute distress. HENT:  Head: Normocephalic.  Eyes:     Pupils: Pupils are equal, round, and reactive to light.  Cardiovascular:     Rate and Rhythm: Normal rate.  Pulmonary:     Effort: Pulmonary effort is normal.  Skin:    General: Skin is warm and dry.  Neurological:     Mental Status: He is alert.   Patient not examined otherwise   UC Treatments / Results  Labs (all labs ordered are listed, but only abnormal results are displayed) Labs Reviewed  GC/CHLAMYDIA PROBE AMP  HIV ANTIBODY (ROUTINE TESTING W REFLEX)  RPR    EKG   Radiology No results found.  Procedures Procedures (including critical care time)  Medications Ordered in UC Medications - No data to display  Initial Impression / Assessment and Plan / UC Course  I have reviewed the triage vital signs and the nursing notes.  Pertinent labs & imaging results that were available during my care of the patient were reviewed by me and considered in my medical decision making (see chart for details).    Screening as listed.  Patient assymptomatic at present    Final Clinical Impressions(s)  / UC Diagnoses   Final diagnoses:  Screening for STD (sexually transmitted disease)   Discharge Instructions   None    ED Prescriptions    None        Lattie Haw, MD 09/13/19 1221

## 2019-09-14 LAB — C. TRACHOMATIS/N. GONORRHOEAE RNA
C. trachomatis RNA, TMA: NOT DETECTED
N. gonorrhoeae RNA, TMA: NOT DETECTED

## 2019-09-14 LAB — HIV ANTIBODY (ROUTINE TESTING W REFLEX): HIV 1&2 Ab, 4th Generation: NONREACTIVE

## 2019-09-14 LAB — SYPHILIS: RPR W/REFLEX TO RPR TITER AND TREPONEMAL ANTIBODIES, TRADITIONAL SCREENING AND DIAGNOSIS ALGORITHM: RPR Ser Ql: NONREACTIVE

## 2021-01-29 ENCOUNTER — Other Ambulatory Visit: Payer: Self-pay

## 2021-01-29 ENCOUNTER — Other Ambulatory Visit (HOSPITAL_COMMUNITY)
Admission: RE | Admit: 2021-01-29 | Discharge: 2021-01-29 | Disposition: A | Discharge status: Home / Self Care | Payer: BLUE CROSS/BLUE SHIELD | Source: Ambulatory Visit | Attending: Family Medicine | Admitting: Family Medicine

## 2021-01-29 ENCOUNTER — Emergency Department
Admission: EM | Admit: 2021-01-29 | Discharge: 2021-01-29 | Disposition: A | Discharge status: Home / Self Care | Payer: BLUE CROSS/BLUE SHIELD | Source: Home / Self Care | Attending: Family Medicine | Admitting: Family Medicine

## 2021-01-29 DIAGNOSIS — Z7689 Persons encountering health services in other specified circumstances: Secondary | ICD-10-CM | POA: Diagnosis not present

## 2021-01-29 DIAGNOSIS — Z113 Encounter for screening for infections with a predominantly sexual mode of transmission: Secondary | ICD-10-CM | POA: Diagnosis not present

## 2021-01-29 NOTE — Discharge Instructions (Addendum)
See My Chart for test results

## 2021-01-29 NOTE — ED Provider Notes (Signed)
[facility]    CSN: [id number] Arrival date & time: 01/29/21  1033      History   Chief Complaint STI testing  HPI Manuel Thompson is a 35 y.o. male.   HPI   Patient is here for his "yearly STD testing".  He has done this over many years in the past.  Testosterone was negative.  It is uncertain why he feels the need to have this testing done.  He does report unprotected sex encounters, but no symptoms.  Past Medical History:  Diagnosis Date  . Asthma     Patient Active Problem List   Diagnosis Date Noted  . Spondylolisthesis at L5-S1 level, with left L5 radiculitis 10/28/2017    History reviewed. No pertinent surgical history.     Home Medications    Prior to Admission medications   Medication Sig Start Date End Date Taking? Authorizing Provider  albuterol (PROVENTIL HFA;VENTOLIN HFA) 108 (90 Base) MCG/ACT inhaler Inhale 1-2 puffs into the lungs every 6 (six) hours as needed for wheezing or shortness of breath. 12/14/18   Lurene Shadow, PA-C  cetirizine (ZYRTEC) 10 MG tablet Take 1 tablet (10 mg total) by mouth daily. 12/14/18   Lurene Shadow, PA-C  fluticasone (FLONASE) 50 MCG/ACT nasal spray Place 2 sprays into both nostrils daily. 12/14/18   Lurene Shadow, PA-C    Family History Family History  Problem Relation Age of Onset  . Hypertension Mother     Social History Social History   Tobacco Use  . Smoking status: Never Smoker  . Smokeless tobacco: Never Used  Vaping Use  . Vaping Use: Never used  Substance Use Topics  . Alcohol use: Yes    Comment: 1-2 q wk  . Drug use: No     Allergies   Amoxicillin   Review of Systems Review of Systems See HPI  Physical Exam Triage Vital Signs ED Triage Vitals  Enc Vitals Group     BP 01/29/21 1046 133/89     Pulse Rate 01/29/21 1046 77     Resp 01/29/21 1046 17     Temp 01/29/21 1046 98 F (36.7 C)     Temp Source 01/29/21 1046 Oral     SpO2 01/29/21 1046 100 %     Weight --       Height --      Head Circumference --      Peak Flow --      Pain Score 01/29/21 1048 0     Pain Loc --      Pain Edu? --      Excl. in GC? --    No data found.  Updated Vital Signs BP 133/89 (BP Location: Right Arm)   Pulse 77   Temp 98 F (36.7 C) (Oral)   Resp 17   SpO2 100%     Physical Exam Constitutional:      General: He is not in acute distress.    Appearance: He is well-developed.  HENT:     Head: Normocephalic and atraumatic.  Eyes:     Conjunctiva/sclera: Conjunctivae normal.     Pupils: Pupils are equal, round, and reactive to light.  Cardiovascular:     Rate and Rhythm: Normal rate.  Pulmonary:     Effort: Pulmonary effort is normal. No respiratory distress.  Abdominal:     General: There is no distension.     Palpations: Abdomen is soft.  Genitourinary:    Comments: Technique for self  swab was discussed Musculoskeletal:        General: Normal range of motion.     Cervical back: Normal range of motion.  Skin:    General: Skin is warm and dry.  Neurological:     Mental Status: He is alert.  Psychiatric:        Behavior: Behavior normal.      UC Treatments / Results  Labs (all labs ordered are listed, but only abnormal results are displayed) Labs Reviewed  RPR  HIV ANTIBODY (ROUTINE TESTING W REFLEX)  CYTOLOGY, (ORAL, ANAL, URETHRAL) ANCILLARY ONLY    EKG   Radiology No results found.  Procedures Procedures (including critical care time)  Medications Ordered in UC Medications - No data to display  Initial Impression / Assessment and Plan / UC Course  I have reviewed the triage vital signs and the nursing notes.  Pertinent labs & imaging results that were available during my care of the patient were reviewed by me and considered in my medical decision making (see chart for details).     Safe sex information is given to patient Final Clinical Impressions(s) / UC Diagnoses   Final diagnoses:  Encounter for assessment of STD  exposure     Discharge Instructions     See My Chart for test results   ED Prescriptions    None     PDMP not reviewed this encounter.   Eustace Moore, MD 01/29/21 [phone]

## 2021-01-29 NOTE — ED Triage Notes (Signed)
Pt here today for yearly STD testing. Denies any sxs or exposure.

## 2021-01-30 ENCOUNTER — Telehealth: Payer: Self-pay | Admitting: Family Medicine

## 2021-01-30 LAB — HIV ANTIBODY (ROUTINE TESTING W REFLEX): HIV 1&2 Ab, 4th Generation: NONREACTIVE

## 2021-01-30 LAB — CYTOLOGY, (ORAL, ANAL, URETHRAL) ANCILLARY ONLY
Chlamydia: NEGATIVE
Comment: NEGATIVE
Comment: NEGATIVE
Comment: NORMAL
Neisseria Gonorrhea: NEGATIVE
Trichomonas: POSITIVE — AB

## 2021-01-30 LAB — RPR: RPR Ser Ql: NONREACTIVE

## 2021-01-30 MED ORDER — METRONIDAZOLE 500 MG PO TABS: 500.0000 mg | ORAL_TABLET | Freq: Two times a day (BID) | ORAL | 0 refills | Status: AC

## 2021-01-30 NOTE — Telephone Encounter (Signed)
Patient called with his STI testing results.  He understands that he is positive for trichomonas.  He is informed this is an STI.  He was informed that he needs to contact his sexual partners.  Avoid sexual encounters for 7 days after treatment.  Metronidazole sent to his [facility] in [facility]

## 2021-02-06 ENCOUNTER — Telehealth: Payer: Self-pay | Admitting: Emergency Medicine

## 2021-02-06 NOTE — Telephone Encounter (Signed)
Call back to pt regarding his voice mail " what are my next steps?" RN confirmed 2 identifiers. Pt confirmed he had completed the flagyl. Pt asked when he should be re-tested. RN stated there was no need to retest if pt practiced safe sex, had abstained from sexual relations for 1 week after he finished the medication. Pt stated he would like to be re-tested in the near future. RN informed pt that he could be retested at his PCP, the [facility] or here at [facility]. The best days for testing at [facility] thru Thursday for quicker test results. Specimens are not sent out until Monday if they are collected on Friday afternoon through Sunday. Pt verbalized an understanding

## 2021-02-19 ENCOUNTER — Emergency Department
Admission: EM | Admit: 2021-02-19 | Discharge: 2021-02-19 | Disposition: A | Discharge status: Home / Self Care | Payer: BLUE CROSS/BLUE SHIELD | Source: Home / Self Care

## 2021-02-19 ENCOUNTER — Other Ambulatory Visit: Payer: Self-pay

## 2021-02-19 DIAGNOSIS — Z113 Encounter for screening for infections with a predominantly sexual mode of transmission: Secondary | ICD-10-CM | POA: Diagnosis not present

## 2021-02-19 NOTE — ED Triage Notes (Addendum)
Pt presents to Urgent Care to have f/u STI testing d/t recent dx and treatment of Trichomonas. Denies s/s.

## 2021-02-19 NOTE — Discharge Instructions (Addendum)
Advised/instructed patient we will follow-up him once lab results return.

## 2021-02-19 NOTE — ED Provider Notes (Signed)
[facility]    CSN: [id number] Arrival date & time: 02/19/21  1117      History   Chief Complaint Chief Complaint  Patient presents with  . STI testing    HPI Kyrell Ruacho is a 35 y.o. male.   HPI 35 year old male presents for follow-up of recent STD testing.  Patient was found to have trichomonas on 01/29/2021 and was treated with Flagyl.  Patient denies current symptoms.  Denies dysuria, hematuria, hematospermia, increased urinary frequency, or inability to urinate.  Denies lesions of mouth or genitals.  Reports completing metronidazole previously prescribed.  Past Medical History:  Diagnosis Date  . Asthma     Patient Active Problem List   Diagnosis Date Noted  . Spondylolisthesis at L5-S1 level, with left L5 radiculitis 10/28/2017    History reviewed. No pertinent surgical history.     Home Medications    Prior to Admission medications   Medication Sig Start Date End Date Taking? Authorizing Provider  albuterol (PROVENTIL HFA;VENTOLIN HFA) 108 (90 Base) MCG/ACT inhaler Inhale 1-2 puffs into the lungs every 6 (six) hours as needed for wheezing or shortness of breath. 12/14/18   Lurene Shadow, PA-C  cetirizine (ZYRTEC) 10 MG tablet Take 1 tablet (10 mg total) by mouth daily. 12/14/18   Lurene Shadow, PA-C  fluticasone (FLONASE) 50 MCG/ACT nasal spray Place 2 sprays into both nostrils daily. 12/14/18   Lurene Shadow, PA-C  metroNIDAZOLE (FLAGYL) 500 MG tablet Take 1 tablet (500 mg total) by mouth 2 (two) times daily. 01/30/21   Eustace Moore, MD    Family History Family History  Problem Relation Age of Onset  . Hypertension Mother   . Hyperlipidemia Father   . Heart Problems Father     Social History Social History   Tobacco Use  . Smoking status: Never Smoker  . Smokeless tobacco: Never Used  Vaping Use  . Vaping Use: Never used  Substance Use Topics  . Alcohol use: Yes    Comment: 1-2 q wk  . Drug use: No     Allergies    Amoxicillin   Review of Systems Review of Systems  Constitutional: Negative.   HENT: Negative.   Eyes: Negative.   Respiratory: Negative.   Cardiovascular: Negative.   Gastrointestinal: Negative.   Genitourinary: Negative.   Musculoskeletal: Negative.   Skin: Negative.   Neurological: Negative.      Physical Exam Triage Vital Signs ED Triage Vitals  Enc Vitals Group     BP 02/19/21 1154 (!) 136/92     Pulse Rate 02/19/21 1154 78     Resp 02/19/21 1154 20     Temp 02/19/21 1154 98.3 F (36.8 C)     Temp Source 02/19/21 1154 Oral     SpO2 02/19/21 1154 98 %     Weight 02/19/21 1151 200 lb (90.7 kg)     Height 02/19/21 1151 6' 0.5" (1.842 m)     Head Circumference --      Peak Flow --      Pain Score 02/19/21 1151 0     Pain Loc --      Pain Edu? --      Excl. in GC? --    No data found.  Updated Vital Signs BP (!) 136/92 (BP Location: Right Arm)   Pulse 78   Temp 98.3 F (36.8 C) (Oral)   Resp 20   Ht 6' 0.5" (1.842 m)   Wt 200 lb (90.7 kg)  SpO2 98%   BMI 26.75 kg/m      Physical Exam Vitals and nursing note reviewed.  Constitutional:      General: He is not in acute distress.    Appearance: Normal appearance. He is not ill-appearing.  HENT:     Head: Normocephalic and atraumatic.     Mouth/Throat:     Mouth: Mucous membranes are moist.     Pharynx: Oropharynx is clear.  Eyes:     Extraocular Movements: Extraocular movements intact.     Conjunctiva/sclera: Conjunctivae normal.     Pupils: Pupils are equal, round, and reactive to light.  Cardiovascular:     Rate and Rhythm: Normal rate and regular rhythm.     Pulses: Normal pulses.     Heart sounds: Normal heart sounds.  Pulmonary:     Effort: Pulmonary effort is normal.     Breath sounds: Normal breath sounds. No wheezing, rhonchi or rales.  Musculoskeletal:        General: Normal range of motion.     Cervical back: Neck supple.  Lymphadenopathy:     Cervical: No cervical adenopathy.   Skin:    General: Skin is warm and dry.  Neurological:     General: No focal deficit present.     Mental Status: He is alert and oriented to person, place, and time.  Psychiatric:        Mood and Affect: Mood normal.        Behavior: Behavior normal.      UC Treatments / Results  Labs (all labs ordered are listed, but only abnormal results are displayed) Labs Reviewed  HSV 1/2 AB (IGM), IFA W/RFLX TITER  HEPATITIS C ANTIBODY    EKG   Radiology No results found.  Procedures Procedures (including critical care time)  Medications Ordered in UC Medications - No data to display  Initial Impression / Assessment and Plan / UC Course  I have reviewed the triage vital signs and the nursing notes.  Pertinent labs & imaging results that were available during my care of the patient were reviewed by me and considered in my medical decision making (see chart for details).     MDM: 1. STD screening.  Discharged home, hemodynamically stable. Final Clinical Impressions(s) / UC Diagnoses   Final diagnoses:  Screen for STD (sexually transmitted disease)     Discharge Instructions     Advised/instructed patient we will follow-up him once lab results return.    ED Prescriptions    None     PDMP not reviewed this encounter.   Trevor Iha, FNP 02/19/21 1349

## 2021-02-25 ENCOUNTER — Telehealth: Payer: Self-pay

## 2021-02-25 NOTE — Telephone Encounter (Signed)
Pt called asking about tests results. Call [facility] for status update. Told that they get processed in [place] and take up to 6 days for results. Pt notified of update.

## 2021-02-27 LAB — HSV 1/2 AB (IGM), IFA W/RFLX TITER
HSV 1 IgM Screen: NEGATIVE
HSV 2 IgM Screen: NEGATIVE

## 2021-02-27 LAB — HEPATITIS C ANTIBODY
Hepatitis C Ab: NONREACTIVE
SIGNAL TO CUT-OFF: 0.02 (ref <1.00)

## 2021-03-12 ENCOUNTER — Telehealth: Payer: Self-pay | Admitting: Emergency Medicine

## 2021-03-12 NOTE — Telephone Encounter (Signed)
Call back to [facility] - confirmed 2 identifiers. He confirmed has been celibate since his last visit, has received all of his recent lab results. Pt has increased self-masturbation at home recently and noted a small amount of blood in discharge this am. Provider updated ( Dr Leonides Grills). Pt instructed to reduce frequency of masturbation. Pt denies dysuria.
